# Patient Record
Sex: Female | Born: 1970 | Race: White | Hispanic: No | Marital: Married | Smoking: Never smoker
Health system: Southern US, Community
[De-identification: ages and names within clinical notes are randomized; demographics above are authoritative.]

## PROBLEM LIST (undated history)

## (undated) DIAGNOSIS — K649 Unspecified hemorrhoids: Secondary | ICD-10-CM

## (undated) DIAGNOSIS — Z8619 Personal history of other infectious and parasitic diseases: Secondary | ICD-10-CM

## (undated) DIAGNOSIS — Z0000 Encounter for general adult medical examination without abnormal findings: Secondary | ICD-10-CM

## (undated) DIAGNOSIS — R109 Unspecified abdominal pain: Secondary | ICD-10-CM

## (undated) DIAGNOSIS — R87610 Atypical squamous cells of undetermined significance on cytologic smear of cervix (ASC-US): Secondary | ICD-10-CM

## (undated) DIAGNOSIS — E559 Vitamin D deficiency, unspecified: Secondary | ICD-10-CM

## (undated) DIAGNOSIS — Z872 Personal history of diseases of the skin and subcutaneous tissue: Secondary | ICD-10-CM

## (undated) DIAGNOSIS — Z9889 Other specified postprocedural states: Secondary | ICD-10-CM

## (undated) DIAGNOSIS — F419 Anxiety disorder, unspecified: Secondary | ICD-10-CM

## (undated) DIAGNOSIS — K219 Gastro-esophageal reflux disease without esophagitis: Secondary | ICD-10-CM

## (undated) DIAGNOSIS — R32 Unspecified urinary incontinence: Secondary | ICD-10-CM

## (undated) DIAGNOSIS — R112 Nausea with vomiting, unspecified: Secondary | ICD-10-CM

## (undated) HISTORY — DX: Personal history of other infectious and parasitic diseases: Z86.19

## (undated) HISTORY — DX: Unspecified hemorrhoids: K64.9

## (undated) HISTORY — DX: Unspecified urinary incontinence: R32

## (undated) HISTORY — DX: Gastro-esophageal reflux disease without esophagitis: K21.9

## (undated) HISTORY — DX: Personal history of diseases of the skin and subcutaneous tissue: Z87.2

## (undated) HISTORY — DX: Nausea with vomiting, unspecified: R11.2

## (undated) HISTORY — DX: Unspecified abdominal pain: R10.9

## (undated) HISTORY — DX: Atypical squamous cells of undetermined significance on cytologic smear of cervix (ASC-US): R87.610

## (undated) HISTORY — DX: Encounter for general adult medical examination without abnormal findings: Z00.00

## (undated) HISTORY — DX: Anxiety disorder, unspecified: F41.9

## (undated) HISTORY — DX: Other specified postprocedural states: Z98.890

## (undated) HISTORY — DX: Vitamin D deficiency, unspecified: E55.9

---
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
## 1998-03-20 ENCOUNTER — Encounter: Admission: RE | Admit: 1998-03-20 | Discharge: 1998-06-18 | Payer: Self-pay | Admitting: Gynecology

## 1998-09-19 ENCOUNTER — Inpatient Hospital Stay (HOSPITAL_COMMUNITY): Admission: AD | Admit: 1998-09-19 | Discharge: 1998-09-22 | Payer: Self-pay | Admitting: Gynecology

## 2000-07-15 ENCOUNTER — Other Ambulatory Visit: Admission: RE | Admit: 2000-07-15 | Discharge: 2000-07-15 | Payer: Self-pay | Admitting: *Deleted

## 2001-01-20 ENCOUNTER — Inpatient Hospital Stay (HOSPITAL_COMMUNITY): Admission: AD | Admit: 2001-01-20 | Discharge: 2001-01-22 | Payer: Self-pay | Admitting: Gynecology

## 2001-08-09 ENCOUNTER — Other Ambulatory Visit: Admission: RE | Admit: 2001-08-09 | Discharge: 2001-08-09 | Payer: Self-pay | Admitting: *Deleted

## 2002-10-18 ENCOUNTER — Other Ambulatory Visit: Admission: RE | Admit: 2002-10-18 | Discharge: 2002-10-18 | Payer: Self-pay | Admitting: Gynecology

## 2004-02-13 ENCOUNTER — Emergency Department (HOSPITAL_COMMUNITY): Admission: AD | Admit: 2004-02-13 | Discharge: 2004-02-13 | Payer: Self-pay | Admitting: Family Medicine

## 2004-02-17 ENCOUNTER — Emergency Department (HOSPITAL_COMMUNITY): Admission: AD | Admit: 2004-02-17 | Discharge: 2004-02-17 | Payer: Self-pay | Admitting: Family Medicine

## 2004-02-21 ENCOUNTER — Other Ambulatory Visit: Admission: RE | Admit: 2004-02-21 | Discharge: 2004-02-21 | Payer: Self-pay | Admitting: Gynecology

## 2004-03-21 ENCOUNTER — Emergency Department (HOSPITAL_COMMUNITY): Admission: AD | Admit: 2004-03-21 | Discharge: 2004-03-21 | Payer: Self-pay | Admitting: Family Medicine

## 2005-02-11 ENCOUNTER — Encounter: Admission: RE | Admit: 2005-02-11 | Discharge: 2005-02-11 | Payer: Self-pay | Admitting: Internal Medicine

## 2005-03-06 ENCOUNTER — Other Ambulatory Visit: Admission: RE | Admit: 2005-03-06 | Discharge: 2005-03-06 | Payer: Self-pay | Admitting: Gynecology

## 2006-03-24 ENCOUNTER — Other Ambulatory Visit: Admission: RE | Admit: 2006-03-24 | Discharge: 2006-03-24 | Payer: Self-pay | Admitting: Gynecology

## 2006-09-29 ENCOUNTER — Ambulatory Visit (HOSPITAL_COMMUNITY): Admission: RE | Admit: 2006-09-29 | Discharge: 2006-09-29 | Payer: Self-pay | Admitting: *Deleted

## 2007-03-30 ENCOUNTER — Other Ambulatory Visit: Admission: RE | Admit: 2007-03-30 | Discharge: 2007-03-30 | Payer: Self-pay | Admitting: Gynecology

## 2008-08-21 ENCOUNTER — Encounter: Payer: Self-pay | Admitting: Gynecology

## 2008-08-21 ENCOUNTER — Other Ambulatory Visit: Admission: RE | Admit: 2008-08-21 | Discharge: 2008-08-21 | Payer: Self-pay | Admitting: Gynecology

## 2008-08-21 ENCOUNTER — Ambulatory Visit: Payer: Self-pay | Admitting: Gynecology

## 2009-10-28 ENCOUNTER — Ambulatory Visit: Payer: Self-pay | Admitting: Gynecology

## 2009-10-28 ENCOUNTER — Encounter: Payer: Self-pay | Admitting: Gynecology

## 2009-10-28 ENCOUNTER — Other Ambulatory Visit: Admission: RE | Admit: 2009-10-28 | Discharge: 2009-10-28 | Payer: Self-pay | Admitting: Gynecology

## 2010-04-21 ENCOUNTER — Encounter: Admission: RE | Admit: 2010-04-21 | Discharge: 2010-04-21 | Payer: Self-pay | Admitting: Gynecology

## 2010-10-13 ENCOUNTER — Emergency Department (HOSPITAL_COMMUNITY): Admission: EM | Admit: 2010-10-13 | Discharge: 2010-10-13 | Payer: Self-pay | Admitting: Emergency Medicine

## 2010-10-29 ENCOUNTER — Ambulatory Visit: Payer: Self-pay | Admitting: Gynecology

## 2010-10-29 ENCOUNTER — Other Ambulatory Visit: Admission: RE | Admit: 2010-10-29 | Discharge: 2010-10-29 | Payer: Self-pay | Admitting: Gynecology

## 2011-01-03 ENCOUNTER — Encounter: Payer: Self-pay | Admitting: Internal Medicine

## 2011-02-25 LAB — POCT PREGNANCY, URINE: Preg Test, Ur: NEGATIVE

## 2011-02-25 LAB — POCT URINALYSIS DIPSTICK
Bilirubin Urine: NEGATIVE
Glucose, UA: NEGATIVE mg/dL
Ketones, ur: NEGATIVE mg/dL
Protein, ur: NEGATIVE mg/dL
Specific Gravity, Urine: 1.01 (ref 1.005–1.030)

## 2011-03-25 ENCOUNTER — Other Ambulatory Visit: Payer: Self-pay | Admitting: Gynecology

## 2011-03-25 DIAGNOSIS — Z1231 Encounter for screening mammogram for malignant neoplasm of breast: Secondary | ICD-10-CM

## 2011-04-23 ENCOUNTER — Ambulatory Visit
Admission: RE | Admit: 2011-04-23 | Discharge: 2011-04-23 | Disposition: A | Discharge status: Home / Self Care | Payer: 59 | Source: Ambulatory Visit | Attending: Gynecology | Admitting: Gynecology

## 2011-04-23 DIAGNOSIS — Z1231 Encounter for screening mammogram for malignant neoplasm of breast: Secondary | ICD-10-CM

## 2011-05-01 NOTE — H&P (Signed)
[facility]  Patient:    Valerie Jones, Valerie Jones                       MRN: [id number] Adm. Date:  01/20/01 Attending:  Marcial Pacas P. Fontaine, M.D.                         History and Physical  CHIEF COMPLAINT:              1. Pregnancy at term.                               2. Increased maternal discomfort.                               3. History of prior macrosomia.  HISTORY OF PRESENT ILLNESS:   A 40 year old, G2, P66 female at term gestation with increased maternal discomfort and history of 10 pound infant last delivery for induction secondary to above.  PAST MEDICAL HISTORY:         Uncomplicated.  PAST SURGICAL HISTORY:        Appendectomy.  ALLERGIES:                    SULFA.  REVIEW OF SYSTEMS:            Noncontributory.  FAMILY HISTORY:               Noncontributory.  PHYSICAL EXAMINATION:  VITAL SIGNS:                  Afebrile.  Vital signs are stable.  HEENT:                        Normal.  LUNGS:                        Clear.  CARDIAC:                      Regular rate without rubs, murmurs, or gallops.  ABDOMEN:                      Gravid vertex fetus.  PELVIC:                       Fingertip 50%, -2 station.  ASSESSMENT:                   A 40 year old, G2, P44 female, term gestation, history of prior fetal macrosomia, increasing discomfort, who desires induction.  Options of expectant management versus induction were reviewed, and the patient elected for induction.  We will plan on serial induction to include Cervidil in the p.m., Pitocin in the a.m.  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  The patients questions are answered to her satisfaction. DD:  01/19/01 TD:  01/19/01 Job: [id number]

## 2011-05-01 NOTE — Discharge Summary (Signed)
[facility] of [facility]  Patient:    Valerie Jones, Valerie Jones                       MRN: [id number] Adm. Date:  [id number] Disc. Date: [id number] Attending:  Douglass Rivers Dictator:   Antony Contras, NP                           Discharge Summary  DISCHARGE DIAGNOSES:          1. Intrauterine pregnancy at 40 weeks.                               2. History of increased maternal discomfort.                               3. Planned induction of labor.                               4. Spontaneous onset of labor.  PROCEDURES:                   Normal spontaneous vaginal delivery of viable infant over midline episiotomy.  HISTORY OF PRESENT ILLNESS:   The patient is a 40 year old gravida 2, para 1-0-0-1, with an LMP of Apr 16, 2000, [facility] of January 21, 2001.  The patient had a history of previous macrosomic infant.  LABORATORY DATA:              Blood type O positive, antibody screen negative, RPR, HBsAg, and HIV nonreactive.  MSAFP normal.  GBS positive.  HOSPITAL COURSE AND TREATMENT:                The patient was anticipated to be admitted for induction of labor secondary to her history of prior macrosomia and increased maternal discomfort as she got closer to term, but she did present in spontaneous labor on January 20, 2001, prior to coming in for induction.  She was found to be 4-5 cm dilated with spontaneous rupture of membranes.  She did progress to complete dilatation and was delivered of an Apgar 51, 40 female infant over a midline episiotomy.  Infants weight was 9 pounds 6 ounces, manual extraction of the placenta was required with some shearing of the umbilical cord after this procedure.  The delivery was performed by Dr. Lily Peer.  POSTPARTUM COURSE:            The patient remained afebrile.  Had no difficulty voiding and was able to discharged on her second postpartum day in satisfactory condition.  CBC - hematocrit 28.8, hemoglobin 9.9, WBC 17, platelets  172.  DISPOSITION:                  Follow up in six weeks.  Continue with prenatal vitamins, iron, Motrin and Tylox for pain.  The patient is also given Paxil 20 mg q.d. secondary to a history of mild depression. DD:  02/28/01 TD:  03/01/01 Job: [id number]

## 2011-10-06 ENCOUNTER — Telehealth: Payer: Self-pay | Admitting: *Deleted

## 2011-10-06 ENCOUNTER — Other Ambulatory Visit: Payer: Self-pay | Admitting: *Deleted

## 2011-10-06 DIAGNOSIS — Z3049 Encounter for surveillance of other contraceptives: Secondary | ICD-10-CM

## 2011-10-06 MED ORDER — LEVONORGESTREL 20 MCG/24HR IU IUD: INTRAUTERINE_SYSTEM | Freq: Once | INTRAUTERINE | Status: DC

## 2011-10-06 NOTE — Telephone Encounter (Signed)
Patient informed benefits on Mirena IUD.  Her portion would be $389.50.  Patient does want to proceed and will call with the first day of her period to set up with Dr. Audie Box.

## 2011-10-06 NOTE — Progress Notes (Signed)
Patient portion is $389.50 for Mirena IUD.

## 2011-10-06 NOTE — Telephone Encounter (Signed)
Patient informed benefits for Mirena IUD.  Her portion would be $389.50.  Will call with first day of period to set up insert with Dr. Audie Box.

## 2011-10-06 NOTE — Telephone Encounter (Signed)
Message copied by Libby Maw on Tue Oct 06, 2011 12:03 PM ------      Message from: Richardson Chiquito      Created: Thu Sep 17, 2011  5:28 PM       Pt called for Mirena  Benefits, I was gonna try and do it for you but never got around to it. Can you do this or it can wait til Tuesday and I can help with it. I had checked them back in March but pt hadnt met her deductible so she wanted [place] to check them again. I placed the chart on your desk. Sherrilyn Rist

## 2011-10-06 NOTE — Telephone Encounter (Signed)
Message copied by Libby Maw on Tue Oct 06, 2011 10:54 AM ------      Message from: Richardson Chiquito      Created: Thu Sep 17, 2011  5:28 PM       Pt called for Mirena  Benefits, I was gonna try and do it for you but never got around to it. Can you do this or it can wait til Tuesday and I can help with it. I had checked them back in March but pt hadnt met her deductible so she wanted [place] to check them again. I placed the chart on your desk. Sherrilyn Rist

## 2011-10-13 ENCOUNTER — Other Ambulatory Visit: Payer: Self-pay | Admitting: Gynecology

## 2011-10-13 ENCOUNTER — Ambulatory Visit (INDEPENDENT_AMBULATORY_CARE_PROVIDER_SITE_OTHER): Payer: 59 | Admitting: Gynecology

## 2011-10-13 ENCOUNTER — Encounter: Payer: Self-pay | Admitting: Gynecology

## 2011-10-13 VITALS — BP 116/70

## 2011-10-13 DIAGNOSIS — IMO0001 Reserved for inherently not codable concepts without codable children: Secondary | ICD-10-CM

## 2011-10-13 DIAGNOSIS — Z3041 Encounter for surveillance of contraceptive pills: Secondary | ICD-10-CM

## 2011-10-13 DIAGNOSIS — Z3049 Encounter for surveillance of other contraceptives: Secondary | ICD-10-CM

## 2011-10-13 NOTE — Progress Notes (Signed)
Patient presents for Mirena IUD placement. She is currently on a regular menses. She is ready the booklet has no questions and signed the consent form. I reviewed the insertional process with her and the risks to include infection uterine perforation requiring surgery to remove and the risk of failure as a contraceptive. Patient understands and accepts  Exam Pelvic external BUS vagina normal with slight menses flow cervix normal uterus anteverted normal size midline mobile nontender adnexa without masses or tenderness  Cervix was cleansed with Betadine, single tooth tenaculum anterior lip stabilization, uterus was sounded and a Mirena IUD was placed according to manufacturer's recommendations. The strings were trimmed, the patient tolerated well and will follow up in one month for postinsertional check.

## 2011-11-03 ENCOUNTER — Encounter: Payer: 59 | Admitting: Gynecology

## 2011-11-12 ENCOUNTER — Ambulatory Visit (INDEPENDENT_AMBULATORY_CARE_PROVIDER_SITE_OTHER): Payer: 59 | Admitting: Gynecology

## 2011-11-12 ENCOUNTER — Encounter: Payer: Self-pay | Admitting: Gynecology

## 2011-11-12 VITALS — BP 92/60 | Ht 64.0 in | Wt 132.0 lb

## 2011-11-12 DIAGNOSIS — Z131 Encounter for screening for diabetes mellitus: Secondary | ICD-10-CM

## 2011-11-12 DIAGNOSIS — Z13220 Encounter for screening for lipoid disorders: Secondary | ICD-10-CM

## 2011-11-12 DIAGNOSIS — Z30431 Encounter for routine checking of intrauterine contraceptive device: Secondary | ICD-10-CM

## 2011-11-12 DIAGNOSIS — R823 Hemoglobinuria: Secondary | ICD-10-CM

## 2011-11-12 DIAGNOSIS — Z01419 Encounter for gynecological examination (general) (routine) without abnormal findings: Secondary | ICD-10-CM

## 2011-11-12 NOTE — Progress Notes (Signed)
Addended byCammie Mcgee T on: 11/12/2011 03:16 PM   Modules accepted: Orders

## 2011-11-12 NOTE — Patient Instructions (Signed)
Return in one year for annual exam, sooner if any issues.

## 2011-11-12 NOTE — Progress Notes (Signed)
Valerie Jones 29-Dec-1970 [id number]        40 y.o.  for annual exam.  Doing well. Recently had IUD placed about a month ago has had some intermittent spotting and some mild breast tenderness.  Past medical history,surgical history, medications, allergies, family history and social history were all reviewed and documented in the EPIC chart. ROS:  Was performed and pertinent positives and negatives are included in the history.  Exam: chaperone present Filed Vitals:   11/12/11 1412  BP: 92/60   General appearance  Normal Skin grossly normal Head/Neck normal with no cervical or supraclavicular adenopathy thyroid normal Lungs  clear Cardiac RR, without RMG Abdominal  soft, nontender, without masses, organomegaly or hernia Breasts  examined lying and sitting without masses, retractions, discharge or axillary adenopathy. Pelvic  Ext/BUS/vagina  normal   Cervix  normal  IUD string visualized  Uterus  anteverted, normal size, shape and contour, midline and mobile nontender   Adnexa  Without masses or tenderness    Anus and perineum  normal   Rectovaginal  normal sphincter tone without palpated masses or tenderness.    Assessment/Plan:  40 y.o. female for annual exam.    1. IUD. IUD string visualized some mild spotting will keep track of this assuming it resolves then we'll follow expectantly. Continue she'll call. 2. Breast health. SBE monthly reviewed. Had mammography in May we'll continue with annual mammography.  Some mild breast tenderness which I think is secondary to the IUD. Assuming this resolves will follow. 3. Pap smear. Patient has no history of abnormal Pap smears. She has a number of normal annual reports in her chart. Her last Pap smear was 2011. I reviewed current guidelines with less frequent screening and she agrees with this I did not do a Pap smear this year and we will plan every 3 or Pap smears. 4. Health maintenance. Baseline CBC glucose lipid profile urinalysis ordered.  Assuming she continues well then she'll see [place] in a year sooner as needed.    Dara Lords MD, 2:52 PM 11/12/2011

## 2012-06-08 ENCOUNTER — Other Ambulatory Visit: Payer: Self-pay | Admitting: Gynecology

## 2012-06-08 DIAGNOSIS — Z1231 Encounter for screening mammogram for malignant neoplasm of breast: Secondary | ICD-10-CM

## 2012-07-04 ENCOUNTER — Ambulatory Visit
Admission: RE | Admit: 2012-07-04 | Discharge: 2012-07-04 | Disposition: A | Discharge status: Home / Self Care | Payer: 59 | Source: Ambulatory Visit | Attending: Gynecology | Admitting: Gynecology

## 2012-07-04 DIAGNOSIS — Z1231 Encounter for screening mammogram for malignant neoplasm of breast: Secondary | ICD-10-CM

## 2012-09-27 ENCOUNTER — Encounter (HOSPITAL_COMMUNITY): Payer: Self-pay | Admitting: Emergency Medicine

## 2012-09-27 ENCOUNTER — Emergency Department (HOSPITAL_COMMUNITY)
Admission: EM | Admit: 2012-09-27 | Discharge: 2012-09-27 | Disposition: A | Discharge status: Home / Self Care | Payer: 59 | Source: Home / Self Care

## 2012-09-27 DIAGNOSIS — N390 Urinary tract infection, site not specified: Secondary | ICD-10-CM

## 2012-09-27 LAB — POCT URINALYSIS DIP (DEVICE)
Bilirubin Urine: NEGATIVE
Glucose, UA: NEGATIVE mg/dL
Ketones, ur: NEGATIVE mg/dL
Protein, ur: NEGATIVE mg/dL
Specific Gravity, Urine: <=1.005 (ref 1.005–1.030)

## 2012-09-27 LAB — POCT PREGNANCY, URINE: Preg Test, Ur: NEGATIVE

## 2012-09-27 MED ORDER — CEPHALEXIN 500 MG PO CAPS: 500.0000 mg | ORAL_CAPSULE | Freq: Three times a day (TID) | ORAL | Status: DC

## 2012-09-27 NOTE — ED Notes (Signed)
Pt c/o UTI sx x1 day... Sx include: urgency, burning when urinating.. She denies: pelvic pressure/pain, fevers, vomiting, nausea, diarrhea... Pt is alert w/no signs of distress.

## 2012-09-27 NOTE — ED Provider Notes (Signed)
Medical screening examination/treatment/procedure(s) were performed by resident physician or non-physician practitioner and as supervising physician I was immediately available for consultation/collaboration.   KINDL,JAMES DOUGLAS MD.    James D Kindl, MD 09/27/12 1831 

## 2012-09-27 NOTE — ED Provider Notes (Signed)
History     CSN: [id number]  Arrival date & time 09/27/12  3086   None     Chief Complaint  Patient presents with  . Urinary Tract Infection    (Consider location/radiation/quality/duration/timing/severity/associated sxs/prior treatment) HPI Comments: 41 year old female presents with a one-day history of urinary urgency and dysuria. She denies other symptoms. No fever abdominal pain back pain nausea vomiting or bleeding.  Patient is a 41 y.o. female presenting with urinary tract infection.  Urinary Tract Infection    History reviewed. No pertinent past medical history.  Past Surgical History  Procedure Date  . Appendectomy 1988  . Intrauterine device insertion 10/13/2011    MIRENA    Family History  Problem Relation Age of Onset  . Cancer Paternal Grandfather     NON HODGKIN LYMPHOMA  . Hypertension Father     History  Substance Use Topics  . Smoking status: Never Smoker   . Smokeless tobacco: Never Used  . Alcohol Use: Yes     3 NIGHTS A WEEK ... WINE    OB History    Grav Para Term Preterm Abortions TAB SAB Ect Mult Living   2 2        2       Review of Systems  Constitutional: Negative for fever, activity change and fatigue.  HENT: Negative.   Genitourinary: Positive for dysuria, urgency, frequency and hematuria. Negative for flank pain, vaginal discharge, difficulty urinating, vaginal pain and menstrual problem.  Musculoskeletal: Negative.   Neurological: Negative.     Allergies  Sulfa antibiotics  Home Medications   Current Outpatient Rx  Name Route Sig Dispense Refill  . CEPHALEXIN 500 MG PO CAPS Oral Take 1 capsule (500 mg total) by mouth 3 (three) times daily. 21 capsule 0    BP 92/64  Pulse 49  Temp 98 F (36.7 C) (Oral)  Resp 16  SpO2 100%  LMP 09/27/2012  Physical Exam  Constitutional: She is oriented to person, place, and time. She appears well-developed and well-nourished. No distress.  Eyes: Conjunctivae normal and EOM are  normal.  Neck: Neck supple.  Pulmonary/Chest: Effort normal.  Abdominal: Soft. There is no tenderness.  Musculoskeletal: Normal range of motion. She exhibits no edema.  Neurological: She is alert and oriented to person, place, and time.  Skin: Skin is warm and dry.  Psychiatric: She has a normal mood and affect.    ED Course  Procedures (including critical care time)  Labs Reviewed  POCT URINALYSIS DIP (DEVICE) - Abnormal; Notable for the following:    Hgb urine dipstick MODERATE (*)     Leukocytes, UA MODERATE (*)  Biochemical Testing Only. Please order routine urinalysis from main lab if confirmatory testing is needed.   All other components within normal limits  POCT PREGNANCY, URINE   No results found.   1. UTI (lower urinary tract infection)       MDM  Keflex 500 mg 3 times a day for 7 days. Drink plenty of fluids stay well hydrated may also take AZO 100-200 mg 3 times a day when necessary        Hayden Rasmussen, NP 09/27/12 1021

## 2013-01-17 ENCOUNTER — Encounter: Payer: Self-pay | Admitting: Gynecology

## 2013-01-17 ENCOUNTER — Ambulatory Visit (INDEPENDENT_AMBULATORY_CARE_PROVIDER_SITE_OTHER): Payer: 59 | Admitting: Gynecology

## 2013-01-17 ENCOUNTER — Other Ambulatory Visit (HOSPITAL_COMMUNITY)
Admission: RE | Admit: 2013-01-17 | Discharge: 2013-01-17 | Disposition: A | Discharge status: Home / Self Care | Payer: 59 | Source: Ambulatory Visit | Attending: Gynecology | Admitting: Gynecology

## 2013-01-17 VITALS — BP 110/60 | Ht 64.0 in | Wt 129.0 lb

## 2013-01-17 DIAGNOSIS — Z13220 Encounter for screening for lipoid disorders: Secondary | ICD-10-CM

## 2013-01-17 DIAGNOSIS — Z01419 Encounter for gynecological examination (general) (routine) without abnormal findings: Secondary | ICD-10-CM

## 2013-01-17 DIAGNOSIS — Z30431 Encounter for routine checking of intrauterine contraceptive device: Secondary | ICD-10-CM

## 2013-01-17 DIAGNOSIS — Z1151 Encounter for screening for human papillomavirus (HPV): Secondary | ICD-10-CM | POA: Insufficient documentation

## 2013-01-17 NOTE — Progress Notes (Signed)
Valerie Jones [birth year] [id number]        42 y.o.  [id number] for annual exam.  Doing well. Several issues noted below.  Past medical history,surgical history, medications, allergies, family history and social history were all reviewed and documented in the EPIC chart. ROS:  Was performed and pertinent positives and negatives are included in the history.  Exam: Kim assistant Filed Vitals:   01/17/13 1529  BP: 110/60  Height: 5\' 4"  (1.626 m)  Weight: 129 lb (58.514 kg)   General appearance  Normal Skin grossly normal Head/Neck normal with no cervical or supraclavicular adenopathy thyroid normal Lungs  clear Cardiac RR, without RMG Abdominal  soft, nontender, without masses, organomegaly or hernia Breasts  examined lying and sitting without masses, retractions, discharge or axillary adenopathy. Pelvic  Ext/BUS/vagina  normal   Cervix  Normal with IUD string visualized. Pap/HPV  Uterus  anteverted, normal size, shape and contour, midline and mobile nontender   Adnexa  Without masses or tenderness    Anus and perineum  normal   Rectovaginal  normal sphincter tone without palpated masses or tenderness.    Assessment/Plan:  42 y.o. [id number] female for annual exam.   1. Mirena IUD 09/2011. Patient doing well with light menses. We'll continue to monitor. 2. Difficulty achieving orgasm. Patient notes most recently she has difficulty achieving orgasm. She will start to reach but then it goes away. Nothing else is changed in her life as far as relationships/medications. No other symptoms such as neuropathy type symptoms or diabetic symptoms. Exam is normal with normal sensation.  ItWe'll check a baseline comprehensive metabolic panel, vitamin D level, TSH. Will monitor at present. If other symptoms arise and we'll report these. 3. Mammography 06/2012. We'll continue with annual mammography. SBE monthly reviewed. 4. Pap smear 2011. Pap/HPV done today. We'll plan every 5 year screening if  normalized she has no history of abnormal Pap smears previously. 5. Health maintenance.  CBC comprehensive metabolic panel TSH urinalysis vitamin D lipid profile ordered. Follow for results otherwise in one year, sooner as needed.    Dara Lords MD, 4:03 PM 01/17/2013

## 2013-01-17 NOTE — Patient Instructions (Signed)
Follow up in one year for annual exam 

## 2013-01-18 LAB — COMPREHENSIVE METABOLIC PANEL
AST: 24 U/L (ref 0–37)
Albumin: 4.5 g/dL (ref 3.5–5.2)
Alkaline Phosphatase: 33 U/L — ABNORMAL LOW (ref 39–117)
Chloride: 103 mEq/L (ref 96–112)
Glucose, Bld: 86 mg/dL (ref 70–99)
Potassium: 3.9 mEq/L (ref 3.5–5.3)
Sodium: 140 mEq/L (ref 135–145)
Total Protein: 6.6 g/dL (ref 6.0–8.3)

## 2013-01-18 LAB — URINALYSIS W MICROSCOPIC + REFLEX CULTURE
Casts: NONE SEEN
Crystals: NONE SEEN
Leukocytes, UA: NEGATIVE
Nitrite: NEGATIVE
Specific Gravity, Urine: 1.008 (ref 1.005–1.030)
Squamous Epithelial / LPF: NONE SEEN
pH: 7.5 (ref 5.0–8.0)

## 2013-01-18 LAB — CBC WITH DIFFERENTIAL/PLATELET
Basophils Absolute: 0 10*3/uL (ref 0.0–0.1)
Basophils Relative: 1 % (ref 0–1)
Hemoglobin: 13 g/dL (ref 12.0–15.0)
Lymphocytes Relative: 25 % (ref 12–46)
MCHC: 35.5 g/dL (ref 30.0–36.0)
Monocytes Relative: 6 % (ref 3–12)
Neutro Abs: 4.9 10*3/uL (ref 1.7–7.7)
Neutrophils Relative %: 67 % (ref 43–77)
WBC: 7.2 10*3/uL (ref 4.0–10.5)

## 2013-01-18 LAB — LIPID PANEL: LDL Cholesterol: 75 mg/dL (ref 0–99)

## 2013-01-18 LAB — TSH: TSH: 2.964 u[IU]/mL (ref 0.350–4.500)

## 2013-07-24 ENCOUNTER — Other Ambulatory Visit: Payer: Self-pay

## 2013-07-24 DIAGNOSIS — Z1231 Encounter for screening mammogram for malignant neoplasm of breast: Secondary | ICD-10-CM

## 2013-08-07 ENCOUNTER — Emergency Department
Admission: EM | Admit: 2013-08-07 | Discharge: 2013-08-07 | Disposition: A | Discharge status: Home / Self Care | Payer: 59 | Source: Home / Self Care | Attending: Emergency Medicine | Admitting: Emergency Medicine

## 2013-08-07 ENCOUNTER — Encounter: Payer: Self-pay | Admitting: *Deleted

## 2013-08-07 DIAGNOSIS — L509 Urticaria, unspecified: Secondary | ICD-10-CM

## 2013-08-07 MED ORDER — PREDNISONE 10 MG PO TABS: ORAL_TABLET | ORAL | Status: DC

## 2013-08-07 MED ORDER — METHYLPREDNISOLONE SODIUM SUCC 125 MG IJ SOLR
125.0000 mg | Freq: Once | INTRAMUSCULAR | Status: AC
  Administered 2013-08-07: 125 mg via INTRAMUSCULAR

## 2013-08-07 NOTE — ED Provider Notes (Signed)
CSN: [id number]     Arrival date & time 08/07/13  1502 History     First MD Initiated Contact with Patient 08/07/13 1504     Chief Complaint  Patient presents with  . Urticaria   (Consider location/radiation/quality/duration/timing/severity/associated sxs/prior Treatment) HPI This patient complains of a RASH.  Had poison ivy and cellulitis on leg, improved with prednisone and Clinda, then stopped pred after 5 days and developed rash, so then stopped Clinda today.  Very itchy.   Location: whole body  Onset: last 1-2 days   Course: worsening Self-treated with: Benedryl             Improvement with treatment: minimal improvement.  History Itching: yes  Tenderness: no  New medications/antibiotics: yes, see above  Pet exposure: no  Recent travel or tropical exposure: no  New soaps, shampoos, detergent, clothing: no  Tick/insect exposure: no   Red Flags Feeling ill: no  Fever: no  Facial/tongue swelling/difficulty breathing:  no  Diabetic or immunocompromised: yes, on Prednisone last week     History reviewed. No pertinent past medical history. Past Surgical History  Procedure Laterality Date  . Appendectomy  1988  . Intrauterine device insertion  10/13/2011    MIRENA   Family History  Problem Relation Age of Onset  . Cancer Paternal Grandfather     NON HODGKIN LYMPHOMA  . Hypertension Father    History  Substance Use Topics  . Smoking status: Never Smoker   . Smokeless tobacco: Never Used  . Alcohol Use: Yes     Comment: 3 NIGHTS A WEEK ... WINE   OB History   Grav Para Term Preterm Abortions TAB SAB Ect Mult Living   2 2 2       2      Review of Systems  All other systems reviewed and are negative.    Allergies  Clindamycin/lincomycin and Sulfa antibiotics  Home Medications   Current Outpatient Rx  Name  Route  Sig  Dispense  Refill  . predniSONE (DELTASONE) 10 MG tablet      20mg  BID for 3 days, then 10mg  BID for 3 days, then 10mg  daily for 3  days, then stop, Disp QS   21 tablet   0    BP 104/71  Pulse 51  Temp(Src) 97.7 F (36.5 C) (Oral)  Resp 12  Ht 5\' 4"  (1.626 m)  Wt 134 lb (60.782 kg)  BMI 22.99 kg/m2  SpO2 100% Physical Exam  Nursing note and vitals reviewed. Constitutional: She is oriented to person, place, and time. She appears well-developed and well-nourished.  HENT:  Head: Normocephalic and atraumatic.  Eyes: No scleral icterus.  Neck: Neck supple.  Cardiovascular: Regular rhythm and normal heart sounds.   Pulmonary/Chest: Effort normal and breath sounds normal. No respiratory distress.  Neurological: She is alert and oriented to person, place, and time.  Skin: Skin is warm and dry.  Widespread erythematous rash on whole body, legs, arms, torso.  Worse on legs.  No distinct signs of cellulitis on L leg where had previous infection, but does still have signs of poison ivy dermatitis on legs.  No facial involvement.   OP patent.  Psychiatric: She has a normal mood and affect. Her speech is normal.    ED Course   Procedures (including critical care time)  Labs Reviewed - No data to display No results found. 1. Hives     MDM  Patient with hives secondary to likely Clindamycin.  She has stopped the  med, but I don't believe that the rash represents a continued cellulitis.  So I don't believe that she needs further ABX, but she needs to watch to make sure it doesn't return.  Will give shot of Solumedrol here in clinic, then a longer prednisone taper.  Can continue Benedryl PO as needed.    Marlaine Hind, MD 08/07/13 1534

## 2013-08-07 NOTE — ED Notes (Signed)
Valerie Jones was placed on Clindamycin and prednisone for poison ivy that turned to cellulitis on her right thigh. When she stopped the prednisone and continued the clindamycin she developed widespread hives that have continued.

## 2013-08-24 ENCOUNTER — Ambulatory Visit
Admission: RE | Admit: 2013-08-24 | Discharge: 2013-08-24 | Disposition: A | Discharge status: Home / Self Care | Payer: 59 | Source: Ambulatory Visit

## 2013-08-24 DIAGNOSIS — Z1231 Encounter for screening mammogram for malignant neoplasm of breast: Secondary | ICD-10-CM

## 2013-08-28 ENCOUNTER — Other Ambulatory Visit: Payer: Self-pay | Admitting: Gynecology

## 2013-08-28 DIAGNOSIS — R928 Other abnormal and inconclusive findings on diagnostic imaging of breast: Secondary | ICD-10-CM

## 2013-10-19 ENCOUNTER — Other Ambulatory Visit: Payer: Self-pay

## 2014-01-18 ENCOUNTER — Telehealth: Payer: Self-pay | Admitting: *Deleted

## 2014-01-18 ENCOUNTER — Encounter: Payer: Self-pay | Admitting: Gynecology

## 2014-01-18 ENCOUNTER — Ambulatory Visit (INDEPENDENT_AMBULATORY_CARE_PROVIDER_SITE_OTHER): Payer: 59 | Admitting: Gynecology

## 2014-01-18 VITALS — BP 112/70 | Ht 64.0 in | Wt 132.0 lb

## 2014-01-18 DIAGNOSIS — K644 Residual hemorrhoidal skin tags: Secondary | ICD-10-CM

## 2014-01-18 DIAGNOSIS — L989 Disorder of the skin and subcutaneous tissue, unspecified: Secondary | ICD-10-CM

## 2014-01-18 DIAGNOSIS — Z30431 Encounter for routine checking of intrauterine contraceptive device: Secondary | ICD-10-CM

## 2014-01-18 DIAGNOSIS — Z01419 Encounter for gynecological examination (general) (routine) without abnormal findings: Secondary | ICD-10-CM

## 2014-01-18 LAB — CBC WITH DIFFERENTIAL/PLATELET
BASOS PCT: 0 % (ref 0–1)
Basophils Absolute: 0 10*3/uL (ref 0.0–0.1)
Eosinophils Absolute: 0.1 10*3/uL (ref 0.0–0.7)
Eosinophils Relative: 2 % (ref 0–5)
HCT: 38.6 % (ref 36.0–46.0)
Hemoglobin: 13.1 g/dL (ref 12.0–15.0)
LYMPHS ABS: 1.4 10*3/uL (ref 0.7–4.0)
Lymphocytes Relative: 30 % (ref 12–46)
MCH: 31.3 pg (ref 26.0–34.0)
MCHC: 33.9 g/dL (ref 30.0–36.0)
MCV: 92.3 fL (ref 78.0–100.0)
Monocytes Absolute: 0.5 10*3/uL (ref 0.1–1.0)
Monocytes Relative: 10 % (ref 3–12)
NEUTROS PCT: 58 % (ref 43–77)
Neutro Abs: 2.7 10*3/uL (ref 1.7–7.7)
PLATELETS: 321 10*3/uL (ref 150–400)
RBC: 4.18 MIL/uL (ref 3.87–5.11)
RDW: 13.5 % (ref 11.5–15.5)
WBC: 4.7 10*3/uL (ref 4.0–10.5)

## 2014-01-18 LAB — LIPID PANEL
Cholesterol: 181 mg/dL (ref 0–200)
HDL: 66 mg/dL (ref >39)
LDL CALC: 95 mg/dL (ref 0–99)
Total CHOL/HDL Ratio: 2.7 Ratio
Triglycerides: 101 mg/dL (ref <150)
VLDL: 20 mg/dL (ref 0–40)

## 2014-01-18 LAB — COMPREHENSIVE METABOLIC PANEL
ALK PHOS: 36 U/L — AB (ref 39–117)
ALT: 11 U/L (ref 0–35)
AST: 14 U/L (ref 0–37)
Albumin: 4.5 g/dL (ref 3.5–5.2)
BILIRUBIN TOTAL: 0.7 mg/dL (ref 0.2–1.2)
BUN: 22 mg/dL (ref 6–23)
CO2: 30 meq/L (ref 19–32)
Calcium: 9.3 mg/dL (ref 8.4–10.5)
Chloride: 99 mEq/L (ref 96–112)
Creat: 0.75 mg/dL (ref 0.50–1.10)
Glucose, Bld: 92 mg/dL (ref 70–99)
Potassium: 4.1 mEq/L (ref 3.5–5.3)
SODIUM: 140 meq/L (ref 135–145)
TOTAL PROTEIN: 6.6 g/dL (ref 6.0–8.3)

## 2014-01-18 NOTE — Telephone Encounter (Signed)
Message copied by Thamas Jaegers on Thu Jan 18, 2014  3:49 PM ------      Message from: Anastasio Auerbach      Created: Thu Jan 18, 2014 11:35 AM       Referred to Gen. surgery reference external hemorrhoids bothersome to patient ------

## 2014-01-18 NOTE — Progress Notes (Signed)
Valerie Jones Jan 15, 1971 [id number]        42 y.o.  [id number] for annual exam.  Several issues noted below.  Past medical history,surgical history, problem list, medications, allergies, family history and social history were all reviewed and documented in the EPIC chart.  ROS:  Performed and pertinent positives and negatives are included in the history, assessment and plan .  Exam: Kim assistant Filed Vitals:   01/18/14 1003  BP: 112/70  Height: 5\' 4"  (1.626 m)  Weight: 132 lb (59.875 kg)   General appearance  Normal Skin grossly normal excepting 2 skin tags upper right inner thigh amenorrheic, Mirena IUD Head/Neck normal with no cervical or supraclavicular adenopathy thyroid normal Lungs  clear Cardiac RR, without RMG Abdominal  soft, nontender, without masses, organomegaly or hernia Breasts  examined lying and sitting without masses, retractions, discharge or axillary adenopathy. Pelvic  Ext/BUS/vagina  Normal  Cervix  Normal with IUD string visualized  Uterus  anteverted, normal size, shape and contour, midline and mobile nontender   Adnexa  Without masses or tenderness    Anus and perineum  Normal   Rectovaginal  Normal sphincter tone without palpated masses or tenderness. Old external hemorrhoids noted  Procedure: Skin overlying and surrounding the 2 upper inner right thigh skin tags was cleansed with Betadine, subsequently infiltrated with 1% lidocaine and both skin tags excised to the level of the surrounding skin. Silver nitrate hemostasis applied. Band-Aids applied. Postoperative instructions given. Specimen sent to pathology  Assessment/Plan:  43 y.o. [id number] female for annual exam, amenorrheic, Mirena IUD.   1. Mirena IUD 09/2011. Doing well without menses. IUD string visualized. Continue to follow. 2. Pap smear/HPV negative 2014. No Pap smear done today. No history of significant abnormal Pap smears. Plan repeat at 3-5 year interval. 3. Mammography 08/2013. Continue with  annual mammography. SBE monthly review. 4. External hemorrhoids that flare and are bothersome. Will refer to Gen. surgery to consider banding. 5. 2 classic upper inner right thigh skin tags. Patient wanted removed as per above note. Followup for pathology results. 6. Health maintenance. Baseline CBC comprehensive metabolic panel lipid profile urinalysis ordered. Followup one year, sooner as needed.   Note: This document was prepared with digital dictation and possible smart phrase technology. Any transcriptional errors that result from this process are unintentional.   Anastasio Auerbach MD, 11:26 AM 01/18/2014

## 2014-01-18 NOTE — Patient Instructions (Signed)
Office will call you to arrange surgical appointment for hemorrhoids. Office will call you with the pathology results from the skin biopsy. Followup in one year for annual exam.

## 2014-01-18 NOTE — Telephone Encounter (Signed)
Left message for Valerie Jones at [facility] to schedule and call me back with time date.

## 2014-01-19 LAB — URINALYSIS W MICROSCOPIC + REFLEX CULTURE
BACTERIA UA: NONE SEEN
Bilirubin Urine: NEGATIVE
CRYSTALS: NONE SEEN
Casts: NONE SEEN
Glucose, UA: NEGATIVE mg/dL
Hgb urine dipstick: NEGATIVE
Ketones, ur: NEGATIVE mg/dL
Leukocytes, UA: NEGATIVE
NITRITE: NEGATIVE
Protein, ur: NEGATIVE mg/dL
SPECIFIC GRAVITY, URINE: 1.008 (ref 1.005–1.030)
SQUAMOUS EPITHELIAL / LPF: NONE SEEN
UROBILINOGEN UA: 0.2 mg/dL (ref 0.0–1.0)
pH: 7.5 (ref 5.0–8.0)

## 2014-01-19 NOTE — Telephone Encounter (Signed)
Thayer Headings called back and left message in voicemail that pt appointment will be on 01/29/14 @ 1:30 pm with Dr.Thompson they will contact patient.

## 2014-01-29 ENCOUNTER — Ambulatory Visit (INDEPENDENT_AMBULATORY_CARE_PROVIDER_SITE_OTHER): Payer: Self-pay | Admitting: General Surgery

## 2014-02-01 ENCOUNTER — Encounter (INDEPENDENT_AMBULATORY_CARE_PROVIDER_SITE_OTHER): Payer: Self-pay | Admitting: General Surgery

## 2014-02-01 ENCOUNTER — Ambulatory Visit (INDEPENDENT_AMBULATORY_CARE_PROVIDER_SITE_OTHER): Payer: Commercial Managed Care - PPO | Admitting: General Surgery

## 2014-02-01 VITALS — BP 90/64 | HR 72 | Temp 98.6°F | Resp 16 | Ht 64.0 in | Wt 131.8 lb

## 2014-02-01 DIAGNOSIS — K644 Residual hemorrhoidal skin tags: Secondary | ICD-10-CM | POA: Insufficient documentation

## 2014-02-01 MED ORDER — HYDROCORTISONE 2.5 % RE CREA: 1.0000 "application " | TOPICAL_CREAM | Freq: Two times a day (BID) | RECTAL | Status: DC

## 2014-02-01 NOTE — Patient Instructions (Signed)
HEMORRHOIDS    Did you know... Hemorrhoids are one of the most common ailments known.  More than half the population will develop hemorrhoids, usually after age 45.  Millions of Americans currently suffer from hemorrhoids.  The average person suffers in silence for a long period before seeking medical care.  Today's treatment methods make some types of hemorrhoid removal much less painful.  What are hemorrhoids? Often described as "varicose veins of the anus and rectum", hemorrhoids are enlarged, bulging blood vessels in and about the anus and lower rectum. There are two types of hemorrhoids: external and internal, which refer to their location.  External (outside) hemorrhoids develop near the anus and are covered by very sensitive skin. These are usually painless. However, if a blood clot (thrombosis) develops in an external hemorrhoid, it becomes a painful, hard lump. The external hemorrhoid may bleed if it ruptures. Internal (inside) hemorrhoids develop within the anus beneath the lining. Painless bleeding and protrusion during bowel movements are the most common symptom. However, an internal hemorrhoid can cause severe pain if it is completely "prolapsed" - protrudes from the anal opening and cannot be pushed back inside.   What causes hemorrhoids? An exact cause is unknown; however, the upright posture of humans alone forces a great deal of pressure on the rectal veins, which sometimes causes them to bulge. Other contributing factors include:  . Aging  . Chronic constipation or diarrhea  . Pregnancy  . Heredity  . Straining during bowel movements  . Faulty bowel function due to overuse of laxatives or enemas . Spending long periods of time (e.g., reading) on the toilet  Whatever the cause, the tissues supporting the vessels stretch. As a result, the vessels dilate; their walls become thin and bleed. If the stretching and pressure continue, the weakened vessels protrude.  What are the  symptoms? If you notice any of the following, you could have hemorrhoids:  . Bleeding during bowel movements  . Protrusion during bowel movements . Itching in the anal area  . Pain  . Sensitive lump(s)  How are hemorrhoids treated? Mild symptoms can be relieved frequently by increasing the amount of fiber (e.g., fruits, vegetables, breads and cereals) and fluids in the diet. Eliminating excessive straining reduces the pressure on hemorrhoids and helps prevent them from protruding. A sitz bath - sitting in plain warm water for about 10 minutes - can also provide some relief . With these measures, the pain and swelling of most symptomatic hemorrhoids will decrease in two to seven days, and the firm lump should recede within four to six weeks. In cases of severe or persistent pain from a thrombosed hemorrhoid, your physician may elect to remove the hemorrhoid containing the clot with a small incision. Performed under local anesthesia as an outpatient, this procedure generally provides relief. Severe hemorrhoids may require special treatment, much of which can be performed on an outpatient basis.  . Ligation - the rubber band treatment - works effectively on internal hemorrhoids that protrude with bowel movements. A small rubber band is placed over the hemorrhoid, cutting off its blood supply. The hemorrhoid and the band fall off in a few days and the wound usually heals in a week or two. This procedure sometimes produces mild discomfort and bleeding and may need to be repeated for a full effect.  There is a more intense version of this procedure that is done in the OR as outpatient surgery called THD.  It involves identifying blood vessels leading to the   hemorrhoids and then tying them off with sutures.  This method is a little more painful than rubber band ligation but less painful than traditional hemorrhoidectomy and usually does not have to be repeated.  It is best for internal hemorrhoids that  bleed.  Rubber Band Ligation of Internal Hemorrhoids:  A.  Bulging, bleeding, internal hemorrhoid B.  Rubber band applied at the base of the hemorrhoid C.  About 7 days later, the banded hemorrhoid has fallen off leaving a small scar (arrow)  . Injection and Coagulation can also be used on bleeding hemorrhoids that do not protrude. Both methods are relatively painless and cause the hemorrhoid to shrivel up. . Hemorrhoidectomy - surgery to remove the hemorrhoids - is the most complete method for removal of internal and external hemorrhoids. It is necessary when (1) clots repeatedly form in external hemorrhoids; (2) ligation fails to treat internal hemorrhoids; (3) the protruding hemorrhoid cannot be reduced; or (4) there is persistent bleeding. A hemorrhoidectomy removes excessive tissue that causes the bleeding and protrusion. It is done under anesthesia using sutures, and may, depending upon circumstances, require hospitalization and a period of inactivity. Laser hemorrhoidectomies do not offer any advantage over standard operative techniques. They are also quite expensive, and contrary to popular belief, are no less painful.  Do hemorrhoids lead to cancer? No. There is no relationship between hemorrhoids and cancer. However, the symptoms of hemorrhoids, particularly bleeding, are similar to those of colorectal cancer and other diseases of the digestive system. Therefore, it is important that all symptoms are investigated by a physician specially trained in treating diseases of the colon and rectum and that everyone 45 years or older undergo screening tests for colorectal cancer. Do not rely on over-the-counter medications or other self-treatments. See a colorectal surgeon first so your symptoms can be properly evaluated and effective treatment prescribed.  2012 American Society of Colon & Rectal Sur[employer]      GETTING TO GOOD BOWEL HEALTH. Irregular bowel habits such as constipation can lead  to many problems over time.  Having one soft bowel movement a day is the most important way to prevent further problems.  The anorectal canal is designed to handle stretching and feces to safely manage our ability to get rid of solid waste (feces, poop, stool) out of our body.  BUT, hard constipated stools can act like ripping concrete bricks causing inflamed hemorrhoids, anal fissures, abdominal pain and bloating.     The goal: ONE SOFT BOWEL MOVEMENT A DAY!  To have soft, regular bowel movements:    Drink at least 8 tall glasses of water a day.     Take plenty of fiber.  Fiber is the undigested part of plant food that passes into the colon, acting s "natures broom" to encourage bowel motility and movement.  Fiber can absorb and hold large amounts of water. This results in a larger, bulkier stool, which is soft and easier to pass. Work gradually over several weeks up to 6 servings a day of fiber (25g a day even more if needed) in the form of: o Vegetables -- Root (potatoes, carrots, turnips), leafy green (lettuce, salad greens, celery, spinach), or cooked high residue (cabbage, broccoli, etc) o Fruit -- Fresh (unpeeled skin & pulp), Dried (prunes, apricots, cherries, etc ),  or stewed ( applesauce)  o Whole grain breads, pasta, etc (whole wheat)  o Bran cereals    Bulking Agents -- This type of water-retaining fiber generally is easily obtained each day by one   of the following:  o Psyllium bran -- The psyllium plant is remarkable because its ground seeds can retain so much water. This product is available as Metamucil, Konsyl, Effersyllium, Per Diem Fiber, or the less expensive generic preparation in drug and health food stores. Although labeled a laxative, it really is not a laxative.  o Methylcellulose -- This is another fiber derived from wood which also retains water. It is available as Citrucel. o Polyethylene Glycol - and "artificial" fiber commonly called Miralax or Glycolax.  It is helpful for  people with gassy or bloated feelings with regular fiber o Flax Seed - a less gassy fiber than psyllium   No reading or other relaxing activity while on the toilet. If bowel movements take longer than 5 minutes, you are too constipated   AVOID CONSTIPATION.  High fiber and water intake usually takes care of this.  Sometimes a laxative is needed to stimulate more frequent bowel movements, but    Laxatives are not a good long-term solution as it can wear the colon out. o Osmotics (Milk of Magnesia, Fleets phosphosoda, Magnesium citrate, MiraLax, GoLytely) are safer than  o Stimulants (Senokot, Castor Oil, Dulcolax, Ex Lax)    o Do not take laxatives for more than 7days in a row.    IF SEVERELY CONSTIPATED, try a Bowel Retraining Program: o Do not use laxatives.  o Eat a diet high in roughage, such as bran cereals and leafy vegetables.  o Drink six (6) ounces of prune or apricot juice each morning.  o Eat two (2) large servings of stewed fruit each day.  o Take one (1) heaping tablespoon of a psyllium-based bulking agent twice a day. Use sugar-free sweetener when possible to avoid excessive calories.  o Eat a normal breakfast.  o Set aside 15 minutes after breakfast to sit on the toilet, but do not strain to have a bowel movement.  o If you do not have a bowel movement by the third day, use an enema and repeat the above steps.    

## 2014-02-01 NOTE — Progress Notes (Signed)
Chief Complaint  Patient presents with  . Rectal Problems    HISTORY: Valerie Jones is a 43 y.o. female who presents to the office with anal pain.  Other symptoms include occasional bleeding, burning.  This had been occurring for about a year.  She has tried prep H and sitz in the past with good success.  Constipation makes the symptoms worse.   It is intermittent in nature.  Her bowel habits are regular and her bowel movements are occasionally.  Her fiber intake is dietary.  She has never had a colonoscopy.       History reviewed. No pertinent past medical history.    Past Surgical History  Procedure Laterality Date  . Appendectomy  1988  . Intrauterine device insertion  10/13/2011    MIRENA        Current Outpatient Prescriptions  Medication Sig Dispense Refill  . hydrocortisone (ANUSOL-HC) 2.5 % rectal cream Place 1 application rectally 2 (two) times daily.  30 g  0   Current Facility-Administered Medications  Medication Dose Route Frequency Provider Last Rate Last Dose  . levonorgestrel (MIRENA) 20 MCG/24HR IUD   Intrauterine Once Anastasio Auerbach, MD          Allergies  Allergen Reactions  . Clindamycin/Lincomycin Hives  . Sulfa Antibiotics Hives      Family History  Problem Relation Age of Onset  . Hypertension Father   . Cancer Maternal Grandfather     Non Hodgkins Lymphoma    History   Social History  . Marital Status: Married    Spouse Name: N/A    Number of Children: N/A  . Years of Education: N/A   Social History Main Topics  . Smoking status: Never Smoker   . Smokeless tobacco: Never Used  . Alcohol Use: Yes     Comment: 2 NIGHTS A WEEK ... WINE  . Drug Use: No  . Sexual Activity: Yes    Birth Control/ Protection: IUD     Comment: Mirena inserted 10-13-11   Other Topics Concern  . None   Social History Narrative  . None      REVIEW OF SYSTEMS - PERTINENT POSITIVES ONLY: Review of Systems - General ROS: negative for - chills, fever or  weight loss Hematological and Lymphatic ROS: negative for - bleeding problems, blood clots or bruising Respiratory ROS: no cough, shortness of breath, or wheezing Cardiovascular ROS: no chest pain or dyspnea on exertion Gastrointestinal ROS: positive for - abdominal pain and epigastric negative for - blood in stools or change in bowel habits Genito-Urinary ROS: no dysuria, trouble voiding, or hematuria  EXAM: Filed Vitals:   02/01/14 1134  BP: 90/64  Pulse: 72  Temp: 98.6 F (37 C)  Resp: 16    General appearance: alert and cooperative Resp: clear to auscultation bilaterally Cardio: regular rate and rhythm GI: normal findings: soft, non-tender   Procedure: Anoscopy Surgeon: Marcello Moores Diagnosis: anal pain  Assistant: McPherson After the risks and benefits were explained, verbal consent was obtained for above procedure  Anesthesia: none Findings: moderate posterior, R anterior skin tags with small thrombosis noted, grade 1 internal hemorrhoids    ASSESSMENT AND PLAN: Valerie Jones is a 43 y.o. F with anal pain, which appears to be related to inflammation of her external hemorrhoids.  The only surgical option would be standard hemorrhoidectomy.  I have recommened medical treatment with a fiber supplement and anusol prn.  If her symptoms worsen, she will call the office to discuss surgery  further.      Rosario Adie, MD Colon and Rectal Surgery / [facility]      Visit Diagnoses: 1. External hemorrhoids     Primary Care Physician: Anastasio Auerbach, MD

## 2014-05-14 ENCOUNTER — Encounter: Payer: Self-pay | Admitting: Emergency Medicine

## 2014-05-14 ENCOUNTER — Emergency Department
Admission: EM | Admit: 2014-05-14 | Discharge: 2014-05-14 | Disposition: A | Discharge status: Home / Self Care | Payer: 59 | Source: Home / Self Care | Attending: Family Medicine | Admitting: Family Medicine

## 2014-05-14 DIAGNOSIS — L255 Unspecified contact dermatitis due to plants, except food: Secondary | ICD-10-CM

## 2014-05-14 MED ORDER — PREDNISONE 20 MG PO TABS: 20.0000 mg | ORAL_TABLET | Freq: Two times a day (BID) | ORAL | Status: DC

## 2014-05-14 MED ORDER — METHYLPREDNISOLONE SODIUM SUCC 125 MG IJ SOLR
80.0000 mg | Freq: Once | INTRAMUSCULAR | Status: AC
  Administered 2014-05-14: 80 mg via INTRAMUSCULAR

## 2014-05-14 NOTE — ED Provider Notes (Signed)
CSN: [id number]     Arrival date & time 05/14/14  1507 History   First MD Initiated Contact with Patient 05/14/14 1528     Chief Complaint  Patient presents with  . Poison Ivy      HPI Comments: Patient worked in yard yesterday and came into contact with poison ivy, and now has pruritic rash on face, arms, and legs.                               Patient is a 43 y.o. female presenting with rash. The history is provided by the patient.  Rash Pain location: face, arms, and legs. Pain quality comment:  Itching Pain severity:  Mild Onset quality:  Gradual Duration:  1 day Timing:  Constant Progression:  Worsening Chronicity:  New Context comment:  Poison ivy contact Relieved by:  Nothing Worsened by:  Nothing tried Ineffective treatments:  None tried Associated symptoms: no chills, no fatigue, no fever, no nausea and no sore throat     History reviewed. No pertinent past medical history. Past Surgical History  Procedure Laterality Date  . Appendectomy  1988  . Intrauterine device insertion  10/13/2011    MIRENA   Family History  Problem Relation Age of Onset  . Hypertension Father   . Cancer Maternal Grandfather     Non Hodgkins Lymphoma   History  Substance Use Topics  . Smoking status: Never Smoker   . Smokeless tobacco: Never Used  . Alcohol Use: Yes     Comment: 2 NIGHTS A WEEK ... WINE   OB History   Grav Para Term Preterm Abortions TAB SAB Ect Mult Living   2 2 2       2      Review of Systems  Constitutional: Negative for fever, chills and fatigue.  HENT: Negative for sore throat.   Gastrointestinal: Negative for nausea.  Skin: Positive for rash.  All other systems reviewed and are negative.   Allergies  Clindamycin/lincomycin and Sulfa antibiotics  Home Medications   Prior to Admission medications   Medication Sig Start Date End Date Taking? Authorizing Provider  hydrocortisone (ANUSOL-HC) 2.5 % rectal cream Place 1 application rectally 2 (two) times  daily. 3/38/25   Leighton Ruff, MD  predniSONE (DELTASONE) 20 MG tablet Take 1 tablet (20 mg total) by mouth 2 (two) times daily. Take with food.  Begin 05/15/14. 05/14/14   Kandra Nicolas, MD   BP 109/69  Pulse 54  Temp(Src) 98.2 F (36.8 C) (Oral)  Resp 14  Wt 128 lb (58.06 kg)  SpO2 100% Physical Exam  Nursing note and vitals reviewed. Constitutional: She is oriented to person, place, and time. She appears well-developed and well-nourished. No distress.  HENT:  Head: Normocephalic.    Mouth/Throat: Oropharynx is clear and moist.  Eyes: Conjunctivae are normal. Pupils are equal, round, and reactive to light.  Neurological: She is alert and oriented to person, place, and time.  Skin: Skin is warm and dry. Rash noted.     On arms, legs, and face are scattered erythematous macules and linear lesions.  Minimal swelling of face.    ED Course  Procedures  none      MDM   1. Rhus dermatitis    Solumedrol 80mg .IM.  Begin prednisone burst tomorrow. May take Benadryl at bedtime for itching. Followup with Family Doctor if not improved in about 5 days.    Kandra Nicolas,  MD 05/14/14 1711

## 2014-05-14 NOTE — ED Notes (Signed)
Poison ivy rash to face, nearing eyes, arms and legs x 24 hours.

## 2014-05-14 NOTE — Discharge Instructions (Signed)
May take Benadryl at bedtime for itching.   Poison [employer] ivy is a inflammation of the skin (contact dermatitis) caused by touching the allergens on the leaves of the ivy plant following previous exposure to the plant. The rash usually appears 48 hours after exposure. The rash is usually bumps (papules) or blisters (vesicles) in a linear pattern. Depending on your own sensitivity, the rash may simply cause redness and itching, or it may also progress to blisters which may break open. These must be well cared for to prevent secondary bacterial (germ) infection, followed by scarring. Keep any open areas dry, clean, dressed, and covered with an antibacterial ointment if needed. The eyes may also get puffy. The puffiness is worst in the morning and gets better as the day progresses. This dermatitis usually heals without scarring, within 2 to 3 weeks without treatment. HOME CARE INSTRUCTIONS  Thoroughly wash with soap and water as soon as you have been exposed to poison ivy. You have about one half hour to remove the plant resin before it will cause the rash. This washing will destroy the oil or antigen on the skin that is causing, or will cause, the rash. Be sure to wash under your fingernails as any plant resin there will continue to spread the rash. Do not rub skin vigorously when washing affected area. Poison ivy cannot spread if no oil from the plant remains on your body. A rash that has progressed to weeping sores will not spread the rash unless you have not washed thoroughly. It is also important to wash any clothes you have been wearing as these may carry active allergens. The rash will return if you wear the unwashed clothing, even several days later. Avoidance of the plant in the future is the best measure. Poison ivy plant can be recognized by the number of leaves. Generally, poison ivy has three leaves with flowering branches on a single stem. Diphenhydramine may be purchased over the counter and  used as needed for itching. Do not drive with this medication if it makes you drowsy.Ask your caregiver about medication for children. SEEK MEDICAL CARE IF:  Open sores develop.  Redness spreads beyond area of rash.  You notice purulent (pus-like) discharge.  You have increased pain.  Other signs of infection develop (such as fever). Document Released: 11/27/2000 Document Revised: 02/22/2012 Document Reviewed: 10/16/2009 [facility] Patient Information 2014 [place], [place].

## 2014-05-23 ENCOUNTER — Emergency Department (HOSPITAL_BASED_OUTPATIENT_CLINIC_OR_DEPARTMENT_OTHER)
Admission: EM | Admit: 2014-05-23 | Discharge: 2014-05-24 | Disposition: A | Discharge status: Home / Self Care | Payer: 59 | Attending: Emergency Medicine | Admitting: Emergency Medicine

## 2014-05-23 ENCOUNTER — Encounter (HOSPITAL_BASED_OUTPATIENT_CLINIC_OR_DEPARTMENT_OTHER): Payer: Self-pay | Admitting: Emergency Medicine

## 2014-05-23 DIAGNOSIS — R197 Diarrhea, unspecified: Secondary | ICD-10-CM | POA: Insufficient documentation

## 2014-05-23 DIAGNOSIS — R112 Nausea with vomiting, unspecified: Secondary | ICD-10-CM

## 2014-05-23 DIAGNOSIS — IMO0002 Reserved for concepts with insufficient information to code with codable children: Secondary | ICD-10-CM | POA: Insufficient documentation

## 2014-05-23 DIAGNOSIS — R42 Dizziness and giddiness: Secondary | ICD-10-CM | POA: Insufficient documentation

## 2014-05-23 MED ORDER — ONDANSETRON HCL 4 MG/2ML IJ SOLN
4.0000 mg | Freq: Once | INTRAMUSCULAR | Status: AC
  Administered 2014-05-23: 4 mg via INTRAVENOUS

## 2014-05-23 MED ORDER — ONDANSETRON HCL 4 MG/2ML IJ SOLN
INTRAMUSCULAR | Status: AC
  Administered 2014-05-23: 4 mg via INTRAVENOUS
  Filled 2014-05-23: qty 2

## 2014-05-23 MED ORDER — SODIUM CHLORIDE 0.9 % IV BOLUS (SEPSIS)
1000.0000 mL | Freq: Once | INTRAVENOUS | Status: AC
  Administered 2014-05-23: 1000 mL via INTRAVENOUS

## 2014-05-23 NOTE — ED Notes (Signed)
Pt c/o vomiting  X 6 hrs  Pt now taking prednisone for poison ivy .

## 2014-05-23 NOTE — ED Notes (Signed)
MD at bedside. 

## 2014-05-23 NOTE — ED Provider Notes (Signed)
CSN: [id number]     Arrival date & time 05/23/14  2158 History  This chart was scribed for Valerie Fines, MD by Randa Evens, ED Scribe. This patient was seen in room MH05/MH05 and the patient's care was started at 11:49 PM.    Chief Complaint  Patient presents with  . Vomiting    The history is provided by the patient. No language interpreter was used.   HPI Comments: Valerie Jones is a 43 y.o. female who presents to the Emergency Department complaining of vomiting onset today. States she has associated nausea, diarrhea and light headedness.  She has been taking prednisone for poison ivy treatment, and is on day 10 of a taper. She called her dermatologist who recommended taking Tagamet; she took 400mg  of Tagamet this evening without relief. She denies fever, abdominal pain, dysuria, or excessive urination.  History reviewed. No pertinent past medical history. Past Surgical History  Procedure Laterality Date  . Appendectomy  1988  . Intrauterine device insertion  10/13/2011    MIRENA   Family History  Problem Relation Age of Onset  . Hypertension Father   . Cancer Maternal Grandfather     Non Hodgkins Lymphoma   History  Substance Use Topics  . Smoking status: Never Smoker   . Smokeless tobacco: Never Used  . Alcohol Use: Yes     Comment: 2 NIGHTS A WEEK ... WINE   OB History   Grav Para Term Preterm Abortions TAB SAB Ect Mult Living   2 2 2       2      Review of Systems  Constitutional: Negative for fatigue.  Gastrointestinal: Positive for nausea, vomiting and diarrhea. Negative for abdominal pain.  Genitourinary: Negative for dysuria.  Neurological: Positive for light-headedness.    Allergies  Clindamycin/lincomycin and Sulfa antibiotics  Home Medications   Prior to Admission medications   Medication Sig Start Date End Date Taking? Authorizing Provider  hydrocortisone (ANUSOL-HC) 2.5 % rectal cream Place 1 application rectally 2 (two) times daily. 01/12/85    Leighton Ruff, MD  predniSONE (DELTASONE) 20 MG tablet Take 1 tablet (20 mg total) by mouth 2 (two) times daily. Take with food.  Begin 05/15/14. 05/14/14   Kandra Nicolas, MD   Triage Vitals: BP 108/79  Pulse 50  Temp(Src) 97.7 F (36.5 C) (Oral)  Resp 16  Ht 5\' 4"  (1.626 m)  Wt 127 lb (57.607 kg)  BMI 21.79 kg/m2  SpO2 100%  Physical Exam  Nursing note and vitals reviewed. General: Well-developed, well-nourished female in no acute distress; appearance consistent with age of record HENT: normocephalic; atraumatic Eyes: pupils equal, round and reactive to light; extraocular muscles intact Neck: supple Heart: regular rate and rhythm Lungs: clear to auscultation bilaterally Abdomen: soft; nondistended; nontender; no masses or hepatosplenomegaly; bowel sounds present Extremities: No deformity; full range of motion; pulses normal Neurologic: Awake, alert and oriented; motor function intact in all extremities and symmetric; no facial droop Skin: Warm and dry; healing rash of face and extremities Psychiatric: Normal mood and affect  ED Course  Procedures (including critical care time) DIAGNOSTIC STUDIES: Oxygen Saturation is 100% on RA, normal by my interpretation.    COORDINATION OF CARE: 11:55 AM-Discussed treatment plan which includes CBG and Zofran with pt at bedside and pt agreed to plan.    MDM  Heart rate of 50 is consistent with heart rate noted on multiple previous visits  Nursing notes and vitals signs, including pulse oximetry, reviewed.  Summary of  this visit's results, reviewed by myself:  Labs:  Results for orders placed during the hospital encounter of 05/23/14 (from the past 24 hour(s))  CBG MONITORING, ED     Status: None   Collection Time    05/24/14 12:04 AM      Result Value Ref Range   Glucose-Capillary 84  70 - 99 mg/dL   2:56 AM Patient feeling much better after IV fluids and Zofran. She is 10 days into her course of prednisone. It seems unusual that  her symptoms would occur this late in the course but she had a similar occurrence with steroids a year ago. We will treat her with Zofran and she has 4 more days of prednisone tapered ago. She was advised to contact her dermatologist regarding the remaining doses.   I personally performed the services described in this documentation, which was scribed in my presence. The recorded information has been reviewed and is accurate.    Valerie Fines, MD 05/24/14 [phone]

## 2014-05-24 LAB — CBG MONITORING, ED: Glucose-Capillary: 84 mg/dL (ref 70–99)

## 2014-05-24 MED ORDER — ONDANSETRON 8 MG PO TBDP: 8.0000 mg | ORAL_TABLET | Freq: Three times a day (TID) | ORAL | Status: DC | PRN

## 2014-05-24 MED ORDER — SODIUM CHLORIDE 0.9 % IV BOLUS (SEPSIS)
1000.0000 mL | Freq: Once | INTRAVENOUS | Status: AC
  Administered 2014-05-24: 1000 mL via INTRAVENOUS

## 2014-05-24 MED ORDER — ACETAMINOPHEN 325 MG PO TABS
650.0000 mg | ORAL_TABLET | Freq: Once | ORAL | Status: AC
  Administered 2014-05-24: 650 mg via ORAL
  Filled 2014-05-24: qty 2

## 2014-05-24 NOTE — ED Notes (Signed)
MD at bedside. 

## 2014-05-24 NOTE — ED Notes (Signed)
CBG obtained per RN Rod Holler

## 2014-09-17 ENCOUNTER — Other Ambulatory Visit: Payer: Self-pay

## 2014-09-17 DIAGNOSIS — Z1231 Encounter for screening mammogram for malignant neoplasm of breast: Secondary | ICD-10-CM

## 2014-09-24 ENCOUNTER — Ambulatory Visit
Admission: RE | Admit: 2014-09-24 | Discharge: 2014-09-24 | Disposition: A | Discharge status: Home / Self Care | Payer: 59 | Source: Ambulatory Visit

## 2014-09-24 DIAGNOSIS — Z1231 Encounter for screening mammogram for malignant neoplasm of breast: Secondary | ICD-10-CM

## 2014-10-09 ENCOUNTER — Telehealth: Payer: Self-pay | Admitting: *Deleted

## 2014-10-09 NOTE — Telephone Encounter (Signed)
okay

## 2014-10-09 NOTE — Telephone Encounter (Signed)
Pt called asking if you would be will to give referral a neuro rehab for Vestibular? Pt has no PCP and c/o positional vertigo. Please advise

## 2014-10-11 NOTE — Telephone Encounter (Signed)
Referral form will be faxed, I left on pt voicemail this has been done.

## 2014-10-15 ENCOUNTER — Encounter (HOSPITAL_BASED_OUTPATIENT_CLINIC_OR_DEPARTMENT_OTHER): Payer: Self-pay | Admitting: Emergency Medicine

## 2014-10-18 ENCOUNTER — Ambulatory Visit: Payer: 59 | Admitting: Family Medicine

## 2014-11-13 ENCOUNTER — Encounter: Payer: Self-pay | Admitting: Physical Therapy

## 2014-11-13 ENCOUNTER — Ambulatory Visit: Payer: 59 | Attending: Gynecology | Admitting: Physical Therapy

## 2014-11-13 DIAGNOSIS — R42 Dizziness and giddiness: Secondary | ICD-10-CM | POA: Insufficient documentation

## 2014-11-13 NOTE — Therapy (Signed)
[facility] [address] [place], [place], [address] Phone: [phone]   Fax:  [phone]  Physical Therapy Evaluation  Patient Details  Name: Anarely Nicholls MRN: [id number] Date of Birth: 1971-06-17  Encounter Date: 11/13/2014      PT End of Session - 11/13/14 1657    Visit Number 1   Number of Visits 4   Date for PT Re-Evaluation 12/14/14   Authorization Type [employer]   Authorization - Visit Number 0   PT Start Time 3818   PT Stop Time 1153   PT Time Calculation (min) 50 min   Activity Tolerance Other (comment)  c/o wooziness and not feeling after SOT      History reviewed. No pertinent past medical history.  Past Surgical History  Procedure Laterality Date  . Appendectomy  1988  . Intrauterine device insertion  10/13/2011    [facility]    There were no vitals taken for this visit.  Visit Diagnosis:  Dizziness and giddiness - Plan: PT plan of care cert/re-cert      Subjective Assessment - 11/13/14 1110    Symptoms Pt. reports symptoms started about 3 years ago while bending over to clean closet; visual disturbance started about 1 month ago  light-headedness; some tinnitus but doesn't correlate with vertigo episode   Currently in Pain? No/denies              PT Education - 11/13/14 1656    Education provided Yes   Education Details instructed pt. in gaze stabilization on floor, progressing to patterned background then to foam surface; tips on gaze staiblization infor given; habituation exercise for movements that provoke vertigo   Person(s) Educated Patient   Methods Explanation;Demonstration;Handout   Comprehension Verbalized understanding;Returned demonstration            PT Long Term Goals - 11/13/14 1705    PT LONG TERM GOAL #1   Title Independent in HEP for vestibular exercises.   Baseline 12-14-14   Time 4   Period Weeks   Status New   PT LONG TERM GOAL #2   Title Improve Dizziness Handicap Index  score to </= 14/100 for demo of improved symtpoms.   Baseline 12-14-14; [place] = 24/100 = mild handicap group   Time 4   Period Weeks   Status New   PT LONG TERM GOAL #3   Title Subjectively report less vertigo after completion of dynamic visual acuity testing to demo improved VOR function.   Baseline 12-14-14   Time 4   Period Weeks   Status New          Plan - 11/13/14 1700    Clinical Impression Statement Pt. presents with vestibular dysfunction with symptoms consistent with motion sensitivity; appears to be provoked by visual stimulation/busy visual and conflicting visual input increases symptoms;  DVA WNL's for test results however, symptoms provoked with this testing; SOT results WNL's but symptoms also provoked with this testing   Pt will benefit from skilled therapeutic intervention in order to improve on the following deficits Other (comment)  vestibular deficits   Rehab Potential Good   PT Frequency 1x / week   PT Duration 4 weeks   PT Treatment/Interventions Therapeutic activities;Patient/family education;Therapeutic exercise;Balance training;Gait training;Neuromuscular re-education  vestibular rehab exercises   PT Next Visit Plan give balance on foam exercises; check HEP   PT Home Exercise Plan gaze stabilization, habituation, balance on foam   Consulted and Agree with Plan of Care Patient  Vestibular Assessment - 11/13/14 1647    General Observation pt. is a 43 year old female with c/o intermittent vertigo/lightheadedness/wooziness that has occurred intermittently with prolonged positioning of bending over to clean out a closet and then reoccurred about a month ago after drinking some alcohol one evening and awakening next morning with vertigo ; this most recent episode was characterized by some visual disturbance   Type of Dizziness "Horizon moves"   Frequency of Dizziness varies   Aggravating Factors Turning head sideways;Forward bending   Relieving  Factors Avoiding busy/distracting areas  avoiding head movements and bending over for prolonged time    Occulomotor Alignment Normal   Smooth Pursuits Intact   Saccades Intact   VOR 1 Head Only (x 1 viewing) provoked symptoms   Dynamic 1 line difference (WNL's)   Sidelying Test Sidelying Right   Sidelying Right Duration no vertigo   Sidelying Left Duration no vertigo   Therapist, occupational Comment composite score 75/100 is WNL's with N= 70/100; somtasensory, visual, and vestibular inputs are all WNL's for pt.'s age population; no falls occurred onany trials of SOT   Sit to Supine No dizziness   Supine to Left Side No dizziness   Supine to Right Side No dizziness   Head Turning x 5 Moderate dizziness   Rolling Right No dizziness   Rolling Left No dizziness   Positional Sensitivities Comments bending over for prolonged periods of time provoke vertigo      FOTO score - pt.'s physical FS primary measure score = 90/100 (higher # greater function); like-patients score = 58/100  Dizziness Handicap Index score = 24/100 (mild handicap group)                 Problem List Patient Active Problem List   Diagnosis Date Noted  . External hemorrhoids 02/01/2014    Valerie Jones 11/13/2014, 5:14 PM   Guido Sander, [place] [address] [place], [place] [address] [phone]

## 2014-11-13 NOTE — Patient Instructions (Signed)
Gaze Stabilization: Standing Feet Apart   Feet shoulder width apart, keeping eyes on target on wall __8__ feet away, tilt head down 15-30 and move head side to side for _60___ seconds. Repeat while moving head up and down for ___60_ seconds. Do _3-5___ sessions per day. Repeat using target on pattern background.  Copyright  [employer]. All rights reserved.

## 2014-12-17 ENCOUNTER — Ambulatory Visit: Payer: 59 | Admitting: Physical Therapy

## 2015-03-06 ENCOUNTER — Telehealth: Payer: Self-pay | Admitting: *Deleted

## 2015-03-06 NOTE — Telephone Encounter (Signed)
Unable to reach patient at time of Pre-Visit Call.  Left voicemail for patient to return call when available.

## 2015-03-07 ENCOUNTER — Encounter: Payer: Self-pay | Admitting: *Deleted

## 2015-03-07 ENCOUNTER — Ambulatory Visit (INDEPENDENT_AMBULATORY_CARE_PROVIDER_SITE_OTHER): Payer: 59 | Admitting: Family Medicine

## 2015-03-07 ENCOUNTER — Encounter: Payer: Self-pay | Admitting: Family Medicine

## 2015-03-07 VITALS — BP 105/70 | HR 50 | Temp 98.0°F | Resp 16 | Ht 64.0 in | Wt 133.1 lb

## 2015-03-07 DIAGNOSIS — Z0000 Encounter for general adult medical examination without abnormal findings: Secondary | ICD-10-CM | POA: Diagnosis not present

## 2015-03-07 DIAGNOSIS — K644 Residual hemorrhoidal skin tags: Secondary | ICD-10-CM

## 2015-03-07 DIAGNOSIS — K648 Other hemorrhoids: Secondary | ICD-10-CM

## 2015-03-07 DIAGNOSIS — Z872 Personal history of diseases of the skin and subcutaneous tissue: Secondary | ICD-10-CM | POA: Diagnosis not present

## 2015-03-07 MED ORDER — TRIAMCINOLONE ACETONIDE 0.1 % EX CREA: 1.0000 "application " | TOPICAL_CREAM | Freq: Two times a day (BID) | CUTANEOUS | Status: DC

## 2015-03-07 MED ORDER — HYDROCORTISONE 2.5 % RE CREA: 1.0000 "application " | TOPICAL_CREAM | Freq: Two times a day (BID) | RECTAL | Status: DC

## 2015-03-07 MED ORDER — METHYLPREDNISOLONE (PAK) 4 MG PO TABS: ORAL_TABLET | ORAL | Status: DC

## 2015-03-07 NOTE — Addendum Note (Signed)
Addended by: Leticia Penna A on: 03/07/2015 09:19 AM   Modules accepted: Orders, Medications

## 2015-03-07 NOTE — Telephone Encounter (Signed)
Pre-Visit Call completed with patient and chart updated.   Pre-Visit Info documented in Specialty Comments under [employer].    

## 2015-03-07 NOTE — Progress Notes (Signed)
Pre visit review using our clinic review tool, if applicable. No additional management support is needed unless otherwise documented below in the visit note. 

## 2015-03-07 NOTE — Patient Instructions (Addendum)
Probiotics such as [employer] or [employer] or online NOW company 10 strain probiotic on [email or web address]  Metmamucil/Psyllium daily for colon health  Witch Hazel astringent as needed   Try Ginger caps or tea for nausea  Dawn dish liquid  Start Zyrtec twice daily and Benadryl at night  Preventive Care for Adults A healthy lifestyle and preventive care can promote health and wellness. Preventive health guidelines for women include the following key practices.  A routine yearly physical is a good way to check with your health care provider about your health and preventive screening. It is a chance to share any concerns and updates on your health and to receive a thorough exam.  Visit your dentist for a routine exam and preventive care every 6 months. Brush your teeth twice a day and floss once a day. Good oral hygiene prevents tooth decay and gum disease.  The frequency of eye exams is based on your age, health, family medical history, use of contact lenses, and other factors. Follow your health care provider's recommendations for frequency of eye exams.  Eat a healthy diet. Foods like vegetables, fruits, whole grains, low-fat dairy products, and lean protein foods contain the nutrients you need without too many calories. Decrease your intake of foods high in solid fats, added sugars, and salt. Eat the right amount of calories for you.Get information about a proper diet from your health care provider, if necessary.  Regular physical exercise is one of the most important things you can do for your health. Most adults should get at least 150 minutes of moderate-intensity exercise (any activity that increases your heart rate and causes you to sweat) each week. In addition, most adults need muscle-strengthening exercises on 2 or more days a week.  Maintain a healthy weight. The body mass index (BMI) is a screening tool to identify possible weight problems. It provides an  estimate of body fat based on height and weight. Your health care provider can find your BMI and can help you achieve or maintain a healthy weight.For adults 20 years and older:  A BMI below 18.5 is considered underweight.  A BMI of 18.5 to 24.9 is normal.  A BMI of 25 to 29.9 is considered overweight.  A BMI of 30 and above is considered obese.  Maintain normal blood lipids and cholesterol levels by exercising and minimizing your intake of saturated fat. Eat a balanced diet with plenty of fruit and vegetables. Blood tests for lipids and cholesterol should begin at age 73 and be repeated every 5 years. If your lipid or cholesterol levels are high, you are over 50, or you are at high risk for heart disease, you may need your cholesterol levels checked more frequently.Ongoing high lipid and cholesterol levels should be treated with medicines if diet and exercise are not working.  If you smoke, find out from your health care provider how to quit. If you do not use tobacco, do not start.  Lung cancer screening is recommended for adults aged 15-80 years who are at high risk for developing lung cancer because of a history of smoking. A yearly low-dose CT scan of the lungs is recommended for people who have at least a 30-pack-year history of smoking and are a current smoker or have quit within the past 15 years. A pack year of smoking is smoking an average of 1 pack of cigarettes a day for 1 year (for example: 1 pack a day for 30 years or 2 packs a  day for 15 years). Yearly screening should continue until the smoker has stopped smoking for at least 15 years. Yearly screening should be stopped for people who develop a health problem that would prevent them from having lung cancer treatment.  If you are pregnant, do not drink alcohol. If you are breastfeeding, be very cautious about drinking alcohol. If you are not pregnant and choose to drink alcohol, do not have more than 1 drink per day. One drink is  considered to be 12 ounces (355 mL) of beer, 5 ounces (148 mL) of wine, or 1.5 ounces (44 mL) of liquor.  Avoid use of street drugs. Do not share needles with anyone. Ask for help if you need support or instructions about stopping the use of drugs.  High blood pressure causes heart disease and increases the risk of stroke. Your blood pressure should be checked at least every 1 to 2 years. Ongoing high blood pressure should be treated with medicines if weight loss and exercise do not work.  If you are 80-66 years old, ask your health care provider if you should take aspirin to prevent strokes.  Diabetes screening involves taking a blood sample to check your fasting blood sugar level. This should be done once every 3 years, after age 97, if you are within normal weight and without risk factors for diabetes. Testing should be considered at a younger age or be carried out more frequently if you are overweight and have at least 1 risk factor for diabetes.  Breast cancer screening is essential preventive care for women. You should practice "breast self-awareness." This means understanding the normal appearance and feel of your breasts and may include breast self-examination. Any changes detected, no matter how small, should be reported to a health care provider. Women in their 45s and 30s should have a clinical breast exam (CBE) by a health care provider as part of a regular health exam every 1 to 3 years. After age 26, women should have a CBE every year. Starting at age 83, women should consider having a mammogram (breast X-ray test) every year. Women who have a family history of breast cancer should talk to their health care provider about genetic screening. Women at a high risk of breast cancer should talk to their health care providers about having an MRI and a mammogram every year.  Breast cancer gene (BRCA)-related cancer risk assessment is recommended for women who have family members with BRCA-related  cancers. BRCA-related cancers include breast, ovarian, tubal, and peritoneal cancers. Having family members with these cancers may be associated with an increased risk for harmful changes (mutations) in the breast cancer genes BRCA1 and BRCA2. Results of the assessment will determine the need for genetic counseling and BRCA1 and BRCA2 testing.  Routine pelvic exams to screen for cancer are no longer recommended for nonpregnant women who are considered low risk for cancer of the pelvic organs (ovaries, uterus, and vagina) and who do not have symptoms. Ask your health care provider if a screening pelvic exam is right for you.  If you have had past treatment for cervical cancer or a condition that could lead to cancer, you need Pap tests and screening for cancer for at least 20 years after your treatment. If Pap tests have been discontinued, your risk factors (such as having a new sexual partner) need to be reassessed to determine if screening should be resumed. Some women have medical problems that increase the chance of getting cervical cancer. In these cases,  your health care provider may recommend more frequent screening and Pap tests.  The HPV test is an additional test that may be used for cervical cancer screening. The HPV test looks for the virus that can cause the cell changes on the cervix. The cells collected during the Pap test can be tested for HPV. The HPV test could be used to screen women aged 1 years and older, and should be used in women of any age who have unclear Pap test results. After the age of 80, women should have HPV testing at the same frequency as a Pap test.  Colorectal cancer can be detected and often prevented. Most routine colorectal cancer screening begins at the age of 82 years and continues through age 48 years. However, your health care provider may recommend screening at an earlier age if you have risk factors for colon cancer. On a yearly basis, your health care provider  may provide home test kits to check for hidden blood in the stool. Use of a small camera at the end of a tube, to directly examine the colon (sigmoidoscopy or colonoscopy), can detect the earliest forms of colorectal cancer. Talk to your health care provider about this at age 47, when routine screening begins. Direct exam of the colon should be repeated every 5-10 years through age 86 years, unless early forms of pre-cancerous polyps or small growths are found.  People who are at an increased risk for hepatitis B should be screened for this virus. You are considered at high risk for hepatitis B if:  You were born in a country where hepatitis B occurs often. Talk with your health care provider about which countries are considered high risk.  Your parents were born in a high-risk country and you have not received a shot to protect against hepatitis B (hepatitis B vaccine).  You have HIV or AIDS.  You use needles to inject street drugs.  You live with, or have sex with, someone who has hepatitis B.  You get hemodialysis treatment.  You take certain medicines for conditions like cancer, organ transplantation, and autoimmune conditions.  Hepatitis C blood testing is recommended for all people born from [birth year] through 1965 and any individual with known risks for hepatitis C.  Practice safe sex. Use condoms and avoid high-risk sexual practices to reduce the spread of sexually transmitted infections (STIs). STIs include gonorrhea, chlamydia, syphilis, trichomonas, herpes, HPV, and human immunodeficiency virus (HIV). Herpes, HIV, and HPV are viral illnesses that have no cure. They can result in disability, cancer, and death.  You should be screened for sexually transmitted illnesses (STIs) including gonorrhea and chlamydia if:  You are sexually active and are younger than 24 years.  You are older than 24 years and your health care provider tells you that you are at risk for this type of  infection.  Your sexual activity has changed since you were last screened and you are at an increased risk for chlamydia or gonorrhea. Ask your health care provider if you are at risk.  If you are at risk of being infected with HIV, it is recommended that you take a prescription medicine daily to prevent HIV infection. This is called preexposure prophylaxis (PrEP). You are considered at risk if:  You are a heterosexual woman, are sexually active, and are at increased risk for HIV infection.  You take drugs by injection.  You are sexually active with a partner who has HIV.  Talk with your health care provider about  whether you are at high risk of being infected with HIV. If you choose to begin PrEP, you should first be tested for HIV. You should then be tested every 3 months for as long as you are taking PrEP.  Osteoporosis is a disease in which the bones lose minerals and strength with aging. This can result in serious bone fractures or breaks. The risk of osteoporosis can be identified using a bone density scan. Women ages 48 years and over and women at risk for fractures or osteoporosis should discuss screening with their health care providers. Ask your health care provider whether you should take a calcium supplement or vitamin D to reduce the rate of osteoporosis.  Menopause can be associated with physical symptoms and risks. Hormone replacement therapy is available to decrease symptoms and risks. You should talk to your health care provider about whether hormone replacement therapy is right for you.  Use sunscreen. Apply sunscreen liberally and repeatedly throughout the day. You should seek shade when your shadow is shorter than you. Protect yourself by wearing long sleeves, pants, a wide-brimmed hat, and sunglasses year round, whenever you are outdoors.  Once a month, do a whole body skin exam, using a mirror to look at the skin on your back. Tell your health care provider of new moles,  moles that have irregular borders, moles that are larger than a pencil eraser, or moles that have changed in shape or color.  Stay current with required vaccines (immunizations).  Influenza vaccine. All adults should be immunized every year.  Tetanus, diphtheria, and acellular pertussis (Td, Tdap) vaccine. Pregnant women should receive 1 dose of Tdap vaccine during each pregnancy. The dose should be obtained regardless of the length of time since the last dose. Immunization is preferred during the 27th-36th week of gestation. An adult who has not previously received Tdap or who does not know her vaccine status should receive 1 dose of Tdap. This initial dose should be followed by tetanus and diphtheria toxoids (Td) booster doses every 10 years. Adults with an unknown or incomplete history of completing a 3-dose immunization series with Td-containing vaccines should begin or complete a primary immunization series including a Tdap dose. Adults should receive a Td booster every 10 years.  Varicella vaccine. An adult without evidence of immunity to varicella should receive 2 doses or a second dose if she has previously received 1 dose. Pregnant females who do not have evidence of immunity should receive the first dose after pregnancy. This first dose should be obtained before leaving the health care facility. The second dose should be obtained 4-8 weeks after the first dose.  Human papillomavirus (HPV) vaccine. Females aged 13-26 years who have not received the vaccine previously should obtain the 3-dose series. The vaccine is not recommended for use in pregnant females. However, pregnancy testing is not needed before receiving a dose. If a female is found to be pregnant after receiving a dose, no treatment is needed. In that case, the remaining doses should be delayed until after the pregnancy. Immunization is recommended for any person with an immunocompromised condition through the age of 2 years if she  did not get any or all doses earlier. During the 3-dose series, the second dose should be obtained 4-8 weeks after the first dose. The third dose should be obtained 24 weeks after the first dose and 16 weeks after the second dose.  Zoster vaccine. One dose is recommended for adults aged 17 years or older unless certain  conditions are present.  Measles, mumps, and rubella (MMR) vaccine. Adults born before [birth year] generally are considered immune to measles and mumps. Adults born in [birth year] or later should have 1 or more doses of MMR vaccine unless there is a contraindication to the vaccine or there is laboratory evidence of immunity to each of the three diseases. A routine second dose of MMR vaccine should be obtained at least 28 days after the first dose for students attending postsecondary schools, health care workers, or international travelers. People who received inactivated measles vaccine or an unknown type of measles vaccine during 1963-1967 should receive 2 doses of MMR vaccine. People who received inactivated mumps vaccine or an unknown type of mumps vaccine before 1979 and are at high risk for mumps infection should consider immunization with 2 doses of MMR vaccine. For females of childbearing age, rubella immunity should be determined. If there is no evidence of immunity, females who are not pregnant should be vaccinated. If there is no evidence of immunity, females who are pregnant should delay immunization until after pregnancy. Unvaccinated health care workers born before [birth year] who lack laboratory evidence of measles, mumps, or rubella immunity or laboratory confirmation of disease should consider measles and mumps immunization with 2 doses of MMR vaccine or rubella immunization with 1 dose of MMR vaccine.  Pneumococcal 13-valent conjugate (PCV13) vaccine. When indicated, a person who is uncertain of her immunization history and has no record of immunization should receive the PCV13 vaccine. An adult  aged 45 years or older who has certain medical conditions and has not been previously immunized should receive 1 dose of PCV13 vaccine. This PCV13 should be followed with a dose of pneumococcal polysaccharide (PPSV23) vaccine. The PPSV23 vaccine dose should be obtained at least 8 weeks after the dose of PCV13 vaccine. An adult aged 11 years or older who has certain medical conditions and previously received 1 or more doses of PPSV23 vaccine should receive 1 dose of PCV13. The PCV13 vaccine dose should be obtained 1 or more years after the last PPSV23 vaccine dose.  Pneumococcal polysaccharide (PPSV23) vaccine. When PCV13 is also indicated, PCV13 should be obtained first. All adults aged 13 years and older should be immunized. An adult younger than age 45 years who has certain medical conditions should be immunized. Any person who resides in a nursing home or long-term care facility should be immunized. An adult smoker should be immunized. People with an immunocompromised condition and certain other conditions should receive both PCV13 and PPSV23 vaccines. People with human immunodeficiency virus (HIV) infection should be immunized as soon as possible after diagnosis. Immunization during chemotherapy or radiation therapy should be avoided. Routine use of PPSV23 vaccine is not recommended for American Indians, [place] Natives, or people younger than 65 years unless there are medical conditions that require PPSV23 vaccine. When indicated, people who have unknown immunization and have no record of immunization should receive PPSV23 vaccine. One-time revaccination 5 years after the first dose of PPSV23 is recommended for people aged 19-64 years who have chronic kidney failure, nephrotic syndrome, asplenia, or immunocompromised conditions. People who received 1-2 doses of PPSV23 before age 90 years should receive another dose of PPSV23 vaccine at age 15 years or later if at least 5 years have passed since the previous  dose. Doses of PPSV23 are not needed for people immunized with PPSV23 at or after age 73 years.  Meningococcal vaccine. Adults with asplenia or persistent complement component deficiencies should receive 2 doses of quadrivalent  meningococcal conjugate (MenACWY-D) vaccine. The doses should be obtained at least 2 months apart. Microbiologists working with certain meningococcal bacteria, [employer] recruits, people at risk during an outbreak, and people who travel to or live in countries with a high rate of meningitis should be immunized. A first-year college student up through age 43 years who is living in a residence hall should receive a dose if she did not receive a dose on or after her 16th birthday. Adults who have certain high-risk conditions should receive one or more doses of vaccine.  Hepatitis A vaccine. Adults who wish to be protected from this disease, have certain high-risk conditions, work with hepatitis A-infected animals, work in hepatitis A research labs, or travel to or work in countries with a high rate of hepatitis A should be immunized. Adults who were previously unvaccinated and who anticipate close contact with an international adoptee during the first 60 days after arrival in the [place] from a country with a high rate of hepatitis A should be immunized.  Hepatitis B vaccine. Adults who wish to be protected from this disease, have certain high-risk conditions, may be exposed to blood or other infectious body fluids, are household contacts or sex partners of hepatitis B positive people, are clients or workers in certain care facilities, or travel to or work in countries with a high rate of hepatitis B should be immunized.  Haemophilus influenzae type b (Hib) vaccine. A previously unvaccinated person with asplenia or sickle cell disease or having a scheduled splenectomy should receive 1 dose of Hib vaccine. Regardless of previous immunization, a recipient of a hematopoietic stem cell  transplant should receive a 3-dose series 6-12 months after her successful transplant. Hib vaccine is not recommended for adults with HIV infection. Preventive Services / Frequency Ages 63 to 57 years  Blood pressure check.** / Every 1 to 2 years.  Lipid and cholesterol check.** / Every 5 years beginning at age 35.  Clinical breast exam.** / Every 3 years for women in their 28s and 59s.  BRCA-related cancer risk assessment.** / For women who have family members with a BRCA-related cancer (breast, ovarian, tubal, or peritoneal cancers).  Pap test.** / Every 2 years from ages 71 through 4. Every 3 years starting at age 55 through age 53 or 11 with a history of 3 consecutive normal Pap tests.  HPV screening.** / Every 3 years from ages 61 through ages 53 to 53 with a history of 3 consecutive normal Pap tests.  Hepatitis C blood test.** / For any individual with known risks for hepatitis C.  Skin self-exam. / Monthly.  Influenza vaccine. / Every year.  Tetanus, diphtheria, and acellular pertussis (Tdap, Td) vaccine.** / Consult your health care provider. Pregnant women should receive 1 dose of Tdap vaccine during each pregnancy. 1 dose of Td every 10 years.  Varicella vaccine.** / Consult your health care provider. Pregnant females who do not have evidence of immunity should receive the first dose after pregnancy.  HPV vaccine. / 3 doses over 6 months, if 28 and younger. The vaccine is not recommended for use in pregnant females. However, pregnancy testing is not needed before receiving a dose.  Measles, mumps, rubella (MMR) vaccine.** / You need at least 1 dose of MMR if you were born in 1957 or later. You may also need a 2nd dose. For females of childbearing age, rubella immunity should be determined. If there is no evidence of immunity, females who are not pregnant should be  vaccinated. If there is no evidence of immunity, females who are pregnant should delay immunization until after  pregnancy.  Pneumococcal 13-valent conjugate (PCV13) vaccine.** / Consult your health care provider.  Pneumococcal polysaccharide (PPSV23) vaccine.** / 1 to 2 doses if you smoke cigarettes or if you have certain conditions.  Meningococcal vaccine.** / 1 dose if you are age 78 to 18 years and a Market researcher living in a residence hall, or have one of several medical conditions, you need to get vaccinated against meningococcal disease. You may also need additional booster doses.  Hepatitis A vaccine.** / Consult your health care provider.  Hepatitis B vaccine.** / Consult your health care provider.  Haemophilus influenzae type b (Hib) vaccine.** / Consult your health care provider. Ages 5 to 78 years  Blood pressure check.** / Every 1 to 2 years.  Lipid and cholesterol check.** / Every 5 years beginning at age 67 years.  Lung cancer screening. / Every year if you are aged 22-80 years and have a 30-pack-year history of smoking and currently smoke or have quit within the past 15 years. Yearly screening is stopped once you have quit smoking for at least 15 years or develop a health problem that would prevent you from having lung cancer treatment.  Clinical breast exam.** / Every year after age 95 years.  BRCA-related cancer risk assessment.** / For women who have family members with a BRCA-related cancer (breast, ovarian, tubal, or peritoneal cancers).  Mammogram.** / Every year beginning at age 68 years and continuing for as long as you are in good health. Consult with your health care provider.  Pap test.** / Every 3 years starting at age 27 years through age 57 or 31 years with a history of 3 consecutive normal Pap tests.  HPV screening.** / Every 3 years from ages 91 years through ages 70 to 53 years with a history of 3 consecutive normal Pap tests.  Fecal occult blood test (FOBT) of stool. / Every year beginning at age 61 years and continuing until age 51 years. You may  not need to do this test if you get a colonoscopy every 10 years.  Flexible sigmoidoscopy or colonoscopy.** / Every 5 years for a flexible sigmoidoscopy or every 10 years for a colonoscopy beginning at age 44 years and continuing until age 69 years.  Hepatitis C blood test.** / For all people born from [birth year] through 1967 and any individual with known risks for hepatitis C.  Skin self-exam. / Monthly.  Influenza vaccine. / Every year.  Tetanus, diphtheria, and acellular pertussis (Tdap/Td) vaccine.** / Consult your health care provider. Pregnant women should receive 1 dose of Tdap vaccine during each pregnancy. 1 dose of Td every 10 years.  Varicella vaccine.** / Consult your health care provider. Pregnant females who do not have evidence of immunity should receive the first dose after pregnancy.  Zoster vaccine.** / 1 dose for adults aged 61 years or older.  Measles, mumps, rubella (MMR) vaccine.** / You need at least 1 dose of MMR if you were born in 1957 or later. You may also need a 2nd dose. For females of childbearing age, rubella immunity should be determined. If there is no evidence of immunity, females who are not pregnant should be vaccinated. If there is no evidence of immunity, females who are pregnant should delay immunization until after pregnancy.  Pneumococcal 13-valent conjugate (PCV13) vaccine.** / Consult your health care provider.  Pneumococcal polysaccharide (PPSV23) vaccine.** / 1 to 2 doses  if you smoke cigarettes or if you have certain conditions.  Meningococcal vaccine.** / Consult your health care provider.  Hepatitis A vaccine.** / Consult your health care provider.  Hepatitis B vaccine.** / Consult your health care provider.  Haemophilus influenzae type b (Hib) vaccine.** / Consult your health care provider. Ages 11 years and over  Blood pressure check.** / Every 1 to 2 years.  Lipid and cholesterol check.** / Every 5 years beginning at age 93 years.  Lung  cancer screening. / Every year if you are aged 44-80 years and have a 30-pack-year history of smoking and currently smoke or have quit within the past 15 years. Yearly screening is stopped once you have quit smoking for at least 15 years or develop a health problem that would prevent you from having lung cancer treatment.  Clinical breast exam.** / Every year after age 68 years.  BRCA-related cancer risk assessment.** / For women who have family members with a BRCA-related cancer (breast, ovarian, tubal, or peritoneal cancers).  Mammogram.** / Every year beginning at age 4 years and continuing for as long as you are in good health. Consult with your health care provider.  Pap test.** / Every 3 years starting at age 63 years through age 63 or 48 years with 3 consecutive normal Pap tests. Testing can be stopped between 65 and 70 years with 3 consecutive normal Pap tests and no abnormal Pap or HPV tests in the past 10 years.  HPV screening.** / Every 3 years from ages 62 years through ages 41 or 57 years with a history of 3 consecutive normal Pap tests. Testing can be stopped between 65 and 70 years with 3 consecutive normal Pap tests and no abnormal Pap or HPV tests in the past 10 years.  Fecal occult blood test (FOBT) of stool. / Every year beginning at age 63 years and continuing until age 53 years. You may not need to do this test if you get a colonoscopy every 10 years.  Flexible sigmoidoscopy or colonoscopy.** / Every 5 years for a flexible sigmoidoscopy or every 10 years for a colonoscopy beginning at age 9 years and continuing until age 85 years.  Hepatitis C blood test.** / For all people born from [birth year] through 1965 and any individual with known risks for hepatitis C.  Osteoporosis screening.** / A one-time screening for women ages 7 years and over and women at risk for fractures or osteoporosis.  Skin self-exam. / Monthly.  Influenza vaccine. / Every year.  Tetanus, diphtheria, and  acellular pertussis (Tdap/Td) vaccine.** / 1 dose of Td every 10 years.  Varicella vaccine.** / Consult your health care provider.  Zoster vaccine.** / 1 dose for adults aged 63 years or older.  Pneumococcal 13-valent conjugate (PCV13) vaccine.** / Consult your health care provider.  Pneumococcal polysaccharide (PPSV23) vaccine.** / 1 dose for all adults aged 66 years and older.  Meningococcal vaccine.** / Consult your health care provider.  Hepatitis A vaccine.** / Consult your health care provider.  Hepatitis B vaccine.** / Consult your health care provider.  Haemophilus influenzae type b (Hib) vaccine.** / Consult your health care provider. ** Family history and personal history of risk and conditions may change your health care provider's recommendations. Document Released: 01/26/2002 Document Revised: 04/16/2014 Document Reviewed: 04/27/2011 [facility] Patient Information 2015 [place], [place]. This information is not intended to replace advice given to you by your health care provider. Make sure you discuss any questions you have with your health care provider.  syllium daily for colon health

## 2015-03-17 ENCOUNTER — Encounter: Payer: Self-pay | Admitting: Family Medicine

## 2015-03-17 DIAGNOSIS — Z872 Personal history of diseases of the skin and subcutaneous tissue: Secondary | ICD-10-CM

## 2015-03-17 HISTORY — DX: Personal history of diseases of the skin and subcutaneous tissue: Z87.2

## 2015-03-17 NOTE — Progress Notes (Signed)
Valerie Jones  [id number] November 04, 1971 03/17/2015      Progress Note-Follow Up  Subjective  Chief Complaint  Chief Complaint  Patient presents with  . Establish Care    HPI  Patient is a 44 y.o. female in today for routine medical care. Patient is in today to establish care. Has not previously had a primary care doctor. Has routinely seen gynecology and has someone with them next month for ongoing care. Her only acute complaint is of intermittent dermatitis from poison ivy when she works in the yard. No significant flare at the moment. Has needed steroids in the past. Does have a dermatologist, Dr. Wilhemina Bonito if she needs them. No acute concerns or acute illness noted. Denies CP/palp/SOB/HA/congestion/fevers/GI or GU c/o. Taking meds as prescribed  Past Medical History  Diagnosis Date  . History of chicken pox   . GERD (gastroesophageal reflux disease)   . Hemorrhoids   . H/O atopic dermatitis 03/17/2015  . Preventative health care 03/17/2015    Past Surgical History  Procedure Laterality Date  . Intrauterine device insertion  10/13/2011    MIRENA  . Appendectomy  1988    Family History  Problem Relation Age of Onset  . Hypertension Father   . Cancer Maternal Grandfather     Non Hodgkins Lymphoma  . Hypertension Sister   . Mental illness Mother     anxiety  . Irritable bowel syndrome Mother   . COPD Maternal Grandmother     smoker  . Kidney disease Maternal Aunt     History   Social History  . Marital Status: Married    Spouse Name: N/A  . Number of Children: N/A  . Years of Education: N/A   Occupational History  . Not on file.   Social History Main Topics  . Smoking status: Never Smoker   . Smokeless tobacco: Never Used  . Alcohol Use: Yes     Comment: 2 NIGHTS A WEEK ... WINE  . Drug Use: No  . Sexual Activity: Yes    Birth Control/ Protection: IUD     Comment: Mirena inserted 10-13-11, lives with husband, 2 kids, no dietary restrictions   Other  Topics Concern  . Not on file   Social History Narrative    No current outpatient prescriptions on file prior to visit.   Current Facility-Administered Medications on File Prior to Visit  Medication Dose Route Frequency Provider Last Rate Last Dose  . levonorgestrel (MIRENA) 20 MCG/24HR IUD   Intrauterine Once Anastasio Auerbach, MD        Allergies  Allergen Reactions  . Clindamycin/Lincomycin Hives  . Sulfa Antibiotics Hives    Review of Systems  Review of Systems  Constitutional: Negative for fever, chills and malaise/fatigue.  HENT: Negative for congestion, hearing loss and nosebleeds.   Eyes: Negative for discharge.  Respiratory: Negative for cough, sputum production, shortness of breath and wheezing.   Cardiovascular: Negative for chest pain, palpitations and leg swelling.  Gastrointestinal: Negative for heartburn, nausea, vomiting, abdominal pain, diarrhea, constipation and blood in stool.  Genitourinary: Negative for dysuria, urgency, frequency and hematuria.  Musculoskeletal: Negative for myalgias, back pain and falls.  Skin: Negative for rash.  Neurological: Negative for dizziness, tremors, sensory change, focal weakness, loss of consciousness, weakness and headaches.  Endo/Heme/Allergies: Negative for polydipsia. Does not bruise/bleed easily.  Psychiatric/Behavioral: Negative for depression and suicidal ideas. The patient is not nervous/anxious and does not have insomnia.     Objective  BP 105/70  mmHg  Pulse 50  Temp(Src) 98 F (36.7 C) (Oral)  Resp 16  Ht 5\' 4"  (1.626 m)  Wt 133 lb 2 oz (60.385 kg)  BMI 22.84 kg/m2  SpO2 97%  Physical Exam  Physical Exam  Constitutional: She is oriented to person, place, and time and well-developed, well-nourished, and in no distress. No distress.  HENT:  Head: Normocephalic and atraumatic.  Right Ear: External ear normal.  Left Ear: External ear normal.  Nose: Nose normal.  Mouth/Throat: Oropharynx is clear and  moist. No oropharyngeal exudate.  Eyes: Conjunctivae are normal. Pupils are equal, round, and reactive to light. Right eye exhibits no discharge. Left eye exhibits no discharge. No scleral icterus.  Neck: Normal range of motion. Neck supple. No thyromegaly present.  Cardiovascular: Normal rate, regular rhythm, normal heart sounds and intact distal pulses.   No murmur heard. Pulmonary/Chest: Effort normal and breath sounds normal. No respiratory distress. She has no wheezes. She has no rales.  Abdominal: Soft. Bowel sounds are normal. She exhibits no distension and no mass. There is no tenderness.  Musculoskeletal: Normal range of motion. She exhibits no edema or tenderness.  Lymphadenopathy:    She has no cervical adenopathy.  Neurological: She is alert and oriented to person, place, and time. She has normal reflexes. No cranial nerve deficit. Coordination normal.  Skin: Skin is warm and dry. No rash noted. She is not diaphoretic.  Psychiatric: Mood, memory and affect normal.    Lab Results  Component Value Date   TSH 2.964 01/17/2013   Lab Results  Component Value Date   WBC 4.7 01/18/2014   HGB 13.1 01/18/2014   HCT 38.6 01/18/2014   MCV 92.3 01/18/2014   PLT 321 01/18/2014   Lab Results  Component Value Date   CREATININE 0.75 01/18/2014   BUN 22 01/18/2014   NA 140 01/18/2014   K 4.1 01/18/2014   CL 99 01/18/2014   CO2 30 01/18/2014   Lab Results  Component Value Date   ALT 11 01/18/2014   AST 14 01/18/2014   ALKPHOS 36* 01/18/2014   BILITOT 0.7 01/18/2014   Lab Results  Component Value Date   CHOL 181 01/18/2014   Lab Results  Component Value Date   HDL 66 01/18/2014   Lab Results  Component Value Date   LDLCALC 95 01/18/2014   Lab Results  Component Value Date   TRIG 101 01/18/2014   Lab Results  Component Value Date   CHOLHDL 2.7 01/18/2014     Assessment & Plan  H/O atopic dermatitis With history of recurrent rash after yard work. Encouraged  to avoid contact with poison ivy when possible, cleanse with Dawn Dishwalking liquid and given a course of steroids to use as needed. Use antihistamine prn, seek care if no response   External hemorrhoids Asymptomatic at present. Encouraged to cleanse with witch hazel astringent prn. Add probiotics and fiber supplements. Increase clear fluids to 64 oz daily   Preventative health care Patient encouraged to maintain heart healthy diet, regular exercise, adequate sleep. Consider daily probiotics. Take medications as prescribed. Sees Dr Phineas Real of GYN has appt for pap next month. Reviewed annual labs from 2015 which were normal. So will wait to repeat unless her clinical picture changes

## 2015-03-17 NOTE — Assessment & Plan Note (Signed)
With history of recurrent rash after yard work. Encouraged to avoid contact with poison ivy when possible, cleanse with Dawn Dishwalking liquid and given a course of steroids to use as needed. Use antihistamine prn, seek care if no response

## 2015-03-17 NOTE — Assessment & Plan Note (Signed)
Asymptomatic at present. Encouraged to cleanse with witch hazel astringent prn. Add probiotics and fiber supplements. Increase clear fluids to 64 oz daily

## 2015-03-17 NOTE — Assessment & Plan Note (Signed)
Patient encouraged to maintain heart healthy diet, regular exercise, adequate sleep. Consider daily probiotics. Take medications as prescribed. Sees Dr Phineas Real of GYN has appt for pap next month. Reviewed annual labs from 2015 which were normal. So will wait to repeat unless her clinical picture changes

## 2015-03-18 ENCOUNTER — Ambulatory Visit (INDEPENDENT_AMBULATORY_CARE_PROVIDER_SITE_OTHER): Payer: 59 | Admitting: Gynecology

## 2015-03-18 ENCOUNTER — Encounter: Payer: Self-pay | Admitting: Gynecology

## 2015-03-18 VITALS — BP 110/64 | Ht 64.0 in | Wt 134.0 lb

## 2015-03-18 DIAGNOSIS — Z01419 Encounter for gynecological examination (general) (routine) without abnormal findings: Secondary | ICD-10-CM

## 2015-03-18 DIAGNOSIS — E559 Vitamin D deficiency, unspecified: Secondary | ICD-10-CM | POA: Diagnosis not present

## 2015-03-18 LAB — COMPREHENSIVE METABOLIC PANEL
ALK PHOS: 38 U/L — AB (ref 39–117)
ALT: 11 U/L (ref 0–35)
AST: 15 U/L (ref 0–37)
Albumin: 4.3 g/dL (ref 3.5–5.2)
BILIRUBIN TOTAL: 0.4 mg/dL (ref 0.2–1.2)
BUN: 19 mg/dL (ref 6–23)
CO2: 29 mEq/L (ref 19–32)
Calcium: 9.3 mg/dL (ref 8.4–10.5)
Chloride: 102 mEq/L (ref 96–112)
Creat: 0.78 mg/dL (ref 0.50–1.10)
Glucose, Bld: 77 mg/dL (ref 70–99)
Potassium: 4.2 mEq/L (ref 3.5–5.3)
Sodium: 138 mEq/L (ref 135–145)
TOTAL PROTEIN: 6.6 g/dL (ref 6.0–8.3)

## 2015-03-18 LAB — CBC WITH DIFFERENTIAL/PLATELET
Basophils Absolute: 0 10*3/uL (ref 0.0–0.1)
Basophils Relative: 1 % (ref 0–1)
EOS PCT: 2 % (ref 0–5)
Eosinophils Absolute: 0.1 10*3/uL (ref 0.0–0.7)
HEMATOCRIT: 38.7 % (ref 36.0–46.0)
HEMOGLOBIN: 12.9 g/dL (ref 12.0–15.0)
Lymphocytes Relative: 27 % (ref 12–46)
Lymphs Abs: 1.2 10*3/uL (ref 0.7–4.0)
MCH: 31.8 pg (ref 26.0–34.0)
MCHC: 33.3 g/dL (ref 30.0–36.0)
MCV: 95.3 fL (ref 78.0–100.0)
MONO ABS: 0.5 10*3/uL (ref 0.1–1.0)
MPV: 9.9 fL (ref 8.6–12.4)
Monocytes Relative: 11 % (ref 3–12)
Neutro Abs: 2.7 10*3/uL (ref 1.7–7.7)
Neutrophils Relative %: 59 % (ref 43–77)
Platelets: 292 10*3/uL (ref 150–400)
RBC: 4.06 MIL/uL (ref 3.87–5.11)
RDW: 13.5 % (ref 11.5–15.5)
WBC: 4.5 10*3/uL (ref 4.0–10.5)

## 2015-03-18 NOTE — Patient Instructions (Signed)
You may obtain a copy of any labs that were done today by logging onto MyChart as outlined in the instructions provided with your AVS (after visit summary). The office will not call with normal lab results but certainly if there are any significant abnormalities then we will contact you.   Health Maintenance, Female A healthy lifestyle and preventative care can promote health and wellness.  Maintain regular health, dental, and eye exams.  Eat a healthy diet. Foods like vegetables, fruits, whole grains, low-fat dairy products, and lean protein foods contain the nutrients you need without too many calories. Decrease your intake of foods high in solid fats, added sugars, and salt. Get information about a proper diet from your caregiver, if necessary.  Regular physical exercise is one of the most important things you can do for your health. Most adults should get at least 150 minutes of moderate-intensity exercise (any activity that increases your heart rate and causes you to sweat) each week. In addition, most adults need muscle-strengthening exercises on 2 or more days a week.   Maintain a healthy weight. The body mass index (BMI) is a screening tool to identify possible weight problems. It provides an estimate of body fat based on height and weight. Your caregiver can help determine your BMI, and can help you achieve or maintain a healthy weight. For adults 20 years and older:  A BMI below 18.5 is considered underweight.  A BMI of 18.5 to 24.9 is normal.  A BMI of 25 to 29.9 is considered overweight.  A BMI of 30 and above is considered obese.  Maintain normal blood lipids and cholesterol by exercising and minimizing your intake of saturated fat. Eat a balanced diet with plenty of fruits and vegetables. Blood tests for lipids and cholesterol should begin at age 61 and be repeated every 5 years. If your lipid or cholesterol levels are high, you are over 50, or you are a high risk for heart  disease, you may need your cholesterol levels checked more frequently.Ongoing high lipid and cholesterol levels should be treated with medicines if diet and exercise are not effective.  If you smoke, find out from your caregiver how to quit. If you do not use tobacco, do not start.  Lung cancer screening is recommended for adults aged 33 80 years who are at high risk for developing lung cancer because of a history of smoking. Yearly low-dose computed tomography (CT) is recommended for people who have at least a 30-pack-year history of smoking and are a current smoker or have quit within the past 15 years. A pack year of smoking is smoking an average of 1 pack of cigarettes a day for 1 year (for example: 1 pack a day for 30 years or 2 packs a day for 15 years). Yearly screening should continue until the smoker has stopped smoking for at least 15 years. Yearly screening should also be stopped for people who develop a health problem that would prevent them from having lung cancer treatment.  If you are pregnant, do not drink alcohol. If you are breastfeeding, be very cautious about drinking alcohol. If you are not pregnant and choose to drink alcohol, do not exceed 1 drink per day. One drink is considered to be 12 ounces (355 mL) of beer, 5 ounces (148 mL) of wine, or 1.5 ounces (44 mL) of liquor.  Avoid use of street drugs. Do not share needles with anyone. Ask for help if you need support or instructions about stopping  the use of drugs.  High blood pressure causes heart disease and increases the risk of stroke. Blood pressure should be checked at least every 1 to 2 years. Ongoing high blood pressure should be treated with medicines, if weight loss and exercise are not effective.  If you are 59 to 46 years old, ask your caregiver if you should take aspirin to prevent strokes.  Diabetes screening involves taking a blood sample to check your fasting blood sugar level. This should be done once every 3  years, after age 91, if you are within normal weight and without risk factors for diabetes. Testing should be considered at a younger age or be carried out more frequently if you are overweight and have at least 1 risk factor for diabetes.  Breast cancer screening is essential preventative care for women. You should practice "breast self-awareness." This means understanding the normal appearance and feel of your breasts and may include breast self-examination. Any changes detected, no matter how small, should be reported to a caregiver. Women in their 66s and 30s should have a clinical breast exam (CBE) by a caregiver as part of a regular health exam every 1 to 3 years. After age 101, women should have a CBE every year. Starting at age 100, women should consider having a mammogram (breast X-ray) every year. Women who have a family history of breast cancer should talk to their caregiver about genetic screening. Women at a high risk of breast cancer should talk to their caregiver about having an MRI and a mammogram every year.  Breast cancer gene (BRCA)-related cancer risk assessment is recommended for women who have family members with BRCA-related cancers. BRCA-related cancers include breast, ovarian, tubal, and peritoneal cancers. Having family members with these cancers may be associated with an increased risk for harmful changes (mutations) in the breast cancer genes BRCA1 and BRCA2. Results of the assessment will determine the need for genetic counseling and BRCA1 and BRCA2 testing.  The Pap test is a screening test for cervical cancer. Women should have a Pap test starting at age 57. Between ages 25 and 35, Pap tests should be repeated every 2 years. Beginning at age 37, you should have a Pap test every 3 years as long as the past 3 Pap tests have been normal. If you had a hysterectomy for a problem that was not cancer or a condition that could lead to cancer, then you no longer need Pap tests. If you are  between ages 50 and 76, and you have had normal Pap tests going back 10 years, you no longer need Pap tests. If you have had past treatment for cervical cancer or a condition that could lead to cancer, you need Pap tests and screening for cancer for at least 20 years after your treatment. If Pap tests have been discontinued, risk factors (such as a new sexual partner) need to be reassessed to determine if screening should be resumed. Some women have medical problems that increase the chance of getting cervical cancer. In these cases, your caregiver may recommend more frequent screening and Pap tests.  The human papillomavirus (HPV) test is an additional test that may be used for cervical cancer screening. The HPV test looks for the virus that can cause the cell changes on the cervix. The cells collected during the Pap test can be tested for HPV. The HPV test could be used to screen women aged 44 years and older, and should be used in women of any age  who have unclear Pap test results. After the age of 55, women should have HPV testing at the same frequency as a Pap test.  Colorectal cancer can be detected and often prevented. Most routine colorectal cancer screening begins at the age of 44 and continues through age 20. However, your caregiver may recommend screening at an earlier age if you have risk factors for colon cancer. On a yearly basis, your caregiver may provide home test kits to check for hidden blood in the stool. Use of a small camera at the end of a tube, to directly examine the colon (sigmoidoscopy or colonoscopy), can detect the earliest forms of colorectal cancer. Talk to your caregiver about this at age 86, when routine screening begins. Direct examination of the colon should be repeated every 5 to 10 years through age 13, unless early forms of pre-cancerous polyps or small growths are found.  Hepatitis C blood testing is recommended for all people born from [birth year] through 1965 and any  individual with known risks for hepatitis C.  Practice safe sex. Use condoms and avoid high-risk sexual practices to reduce the spread of sexually transmitted infections (STIs). Sexually active women aged 36 and younger should be checked for Chlamydia, which is a common sexually transmitted infection. Older women with new or multiple partners should also be tested for Chlamydia. Testing for other STIs is recommended if you are sexually active and at increased risk.  Osteoporosis is a disease in which the bones lose minerals and strength with aging. This can result in serious bone fractures. The risk of osteoporosis can be identified using a bone density scan. Women ages 20 and over and women at risk for fractures or osteoporosis should discuss screening with their caregivers. Ask your caregiver whether you should be taking a calcium supplement or vitamin D to reduce the rate of osteoporosis.  Menopause can be associated with physical symptoms and risks. Hormone replacement therapy is available to decrease symptoms and risks. You should talk to your caregiver about whether hormone replacement therapy is right for you.  Use sunscreen. Apply sunscreen liberally and repeatedly throughout the day. You should seek shade when your shadow is shorter than you. Protect yourself by wearing long sleeves, pants, a wide-brimmed hat, and sunglasses year round, whenever you are outdoors.  Notify your caregiver of new moles or changes in moles, especially if there is a change in shape or color. Also notify your caregiver if a mole is larger than the size of a pencil eraser.  Stay current with your immunizations. Document Released: 06/15/2011 Document Revised: 03/27/2013 Document Reviewed: 06/15/2011 [facility] Patient Information 2014 [place].

## 2015-03-18 NOTE — Progress Notes (Signed)
Valerie Jones 1971/03/01 [id number]        43 y.o.  [id number] for annual exam.  Doing well without complaints.  Past medical history,surgical history, problem list, medications, allergies, family history and social history were all reviewed and documented as reviewed in the EPIC chart.  ROS:  Performed with pertinent positives and negatives included in the history, assessment and plan.   Additional significant findings :  none   Exam: Valerie Jones Vitals:   03/18/15 0902  BP: 110/64  Height: 5\' 4"  (1.626 m)  Weight: 134 lb (60.782 kg)   General appearance:  Normal affect, orientation and appearance. Skin: Grossly normal HEENT: Without gross lesions.  No cervical or supraclavicular adenopathy. Thyroid normal.  Lungs:  Clear without wheezing, rales or rhonchi Cardiac: RR, without RMG Abdominal:  Soft, nontender, without masses, guarding, rebound, organomegaly or hernia Breasts:  Examined lying and sitting without masses, retractions, discharge or axillary adenopathy. Pelvic:  Ext/BUS/vagina normal  Cervix normal with IUD string visualized  Uterus anteverted, normal size, shape and contour, midline and mobile nontender   Adnexa  Without masses or tenderness    Anus and perineum  Normal   Rectovaginal  Normal sphincter tone without palpated masses or tenderness.    Assessment/Plan:  44 y.o. [id number] female for annual exam without menses, Mirena IUD.   1. Mirena IUD 09/2011. Doing well without menses. IUD string visualized. 2. Mammography 09/2014. Continue with annual mammography. SBE monthly reviewed. 3. Pap smears/HPV 01/2013 negative. No Pap smear done today. No history of significant abnormal Pap smears. Repeat Pap smear at 3-5 year interval per current screening guidelines. 4. Vitamin D deficiency.  Vitamin D 25 2014. Was to take extra vitamin D but is not doing this. Check vitamin D level today. 5. Health maintenance.  Lipid profiles normal the last 2 years. Will skip  this year. Check baseline CBC, comprehensive metabolic panel, urinalysis. Follow up 1 year, sooner as needed.    Anastasio Auerbach MD, 9:57 AM 03/18/2015

## 2015-03-19 LAB — URINALYSIS W MICROSCOPIC + REFLEX CULTURE
BILIRUBIN URINE: NEGATIVE
Bacteria, UA: NONE SEEN
CRYSTALS: NONE SEEN
Casts: NONE SEEN
GLUCOSE, UA: NEGATIVE mg/dL
Hgb urine dipstick: NEGATIVE
KETONES UR: NEGATIVE mg/dL
Leukocytes, UA: NEGATIVE
Nitrite: NEGATIVE
Protein, ur: NEGATIVE mg/dL
Squamous Epithelial / LPF: NONE SEEN
Urobilinogen, UA: 0.2 mg/dL (ref 0.0–1.0)
pH: 7 (ref 5.0–8.0)

## 2015-03-19 LAB — VITAMIN D 25 HYDROXY (VIT D DEFICIENCY, FRACTURES): Vit D, 25-Hydroxy: 27 ng/mL — ABNORMAL LOW (ref 30–100)

## 2015-03-21 ENCOUNTER — Other Ambulatory Visit: Payer: Self-pay | Admitting: Gynecology

## 2015-03-21 DIAGNOSIS — E559 Vitamin D deficiency, unspecified: Secondary | ICD-10-CM

## 2015-05-01 ENCOUNTER — Ambulatory Visit (INDEPENDENT_AMBULATORY_CARE_PROVIDER_SITE_OTHER): Payer: 59 | Admitting: Physician Assistant

## 2015-05-01 ENCOUNTER — Encounter: Payer: Self-pay | Admitting: Physician Assistant

## 2015-05-01 VITALS — BP 99/46 | HR 51 | Temp 98.5°F | Ht 64.0 in | Wt 127.8 lb

## 2015-05-01 DIAGNOSIS — J3489 Other specified disorders of nose and nasal sinuses: Secondary | ICD-10-CM

## 2015-05-01 MED ORDER — MUPIROCIN CALCIUM 2 % NA OINT: 1.0000 "application " | TOPICAL_OINTMENT | Freq: Two times a day (BID) | NASAL | Status: DC

## 2015-05-01 NOTE — Progress Notes (Signed)
Pre visit review using our clinic review tool, if applicable. No additional management support is needed unless otherwise documented below in the visit note. 

## 2015-05-01 NOTE — Progress Notes (Signed)
Patient presents to clinic today c/o painful sore in R nasal vestibule first noticed 4 days ago that is progressively enlarging.  Denies trauma or injury. Denies fever, chills, malaise.  Denies lesion elsewhere. Denies personal hx of MRSA skin infection previously.  Past Medical History  Diagnosis Date  . History of chicken pox   . GERD (gastroesophageal reflux disease)   . Hemorrhoids   . H/O atopic dermatitis 03/17/2015  . Preventative health care 03/17/2015    Current Outpatient Prescriptions on File Prior to Visit  Medication Sig Dispense Refill  . hydrocortisone (ANUSOL-HC) 2.5 % rectal cream Place 1 application rectally 2 (two) times daily. 30 g 0   Current Facility-Administered Medications on File Prior to Visit  Medication Dose Route Frequency Provider Last Rate Last Dose  . levonorgestrel (MIRENA) 20 MCG/24HR IUD   Intrauterine Once Anastasio Auerbach, MD        Allergies  Allergen Reactions  . Clindamycin/Lincomycin Hives  . Sulfa Antibiotics Hives    Family History  Problem Relation Age of Onset  . Hypertension Father   . Cancer Maternal Grandfather     Non Hodgkins Lymphoma  . Hypertension Sister   . Mental illness Mother     anxiety  . Irritable bowel syndrome Mother   . COPD Maternal Grandmother     smoker    History   Social History  . Marital Status: Married    Spouse Name: N/A  . Number of Children: N/A  . Years of Education: N/A   Social History Main Topics  . Smoking status: Never Smoker   . Smokeless tobacco: Never Used  . Alcohol Use: 0.0 oz/week    0 Standard drinks or equivalent per week     Comment: 2 NIGHTS A WEEK ... WINE  . Drug Use: No  . Sexual Activity: Yes    Birth Control/ Protection: IUD     Comment: Mirena inserted 10-13-11,,Pt declined sexual Hx    Other Topics Concern  . None   Social History Narrative   Review of Systems - See HPI.  All other ROS are negative.  BP 99/46 mmHg  Pulse 51  Temp(Src) 98.5 F (36.9 C)  (Oral)  Ht 5' 4" (1.626 m)  Wt 127 lb 12.8 oz (57.97 kg)  BMI 21.93 kg/m2  SpO2 98%  Physical Exam  Constitutional: She is oriented to person, place, and time and well-developed, well-nourished, and in no distress.  HENT:  Head: Normocephalic and atraumatic.  Mouth/Throat: Oropharynx is clear and moist.  2-3 mm lesion within the right nasal vestibule with some purulent drainage noted.  Cardiovascular: Normal rate, regular rhythm, normal heart sounds and intact distal pulses.   Pulmonary/Chest: Effort normal and breath sounds normal. No respiratory distress. She has no wheezes. She has no rales. She exhibits no tenderness.  Neurological: She is alert and oriented to person, place, and time.  Skin: Skin is warm and dry.  Psychiatric: Affect normal.  Vitals reviewed.   Recent Results (from the past 2160 hour(s))  CBC with Differential/Platelet     Status: None   Collection Time: 03/18/15  9:46 AM  Result Value Ref Range   WBC 4.5 4.0 - 10.5 K/uL   RBC 4.06 3.87 - 5.11 MIL/uL   Hemoglobin 12.9 12.0 - 15.0 g/dL   HCT 38.7 36.0 - 46.0 %   MCV 95.3 78.0 - 100.0 fL   MCH 31.8 26.0 - 34.0 pg   MCHC 33.3 30.0 - 36.0 g/dL  RDW 13.5 11.5 - 15.5 %   Platelets 292 150 - 400 K/uL   MPV 9.9 8.6 - 12.4 fL   Neutrophils Relative % 59 43 - 77 %   Neutro Abs 2.7 1.7 - 7.7 K/uL   Lymphocytes Relative 27 12 - 46 %   Lymphs Abs 1.2 0.7 - 4.0 K/uL   Monocytes Relative 11 3 - 12 %   Monocytes Absolute 0.5 0.1 - 1.0 K/uL   Eosinophils Relative 2 0 - 5 %   Eosinophils Absolute 0.1 0.0 - 0.7 K/uL   Basophils Relative 1 0 - 1 %   Basophils Absolute 0.0 0.0 - 0.1 K/uL   Smear Review Criteria for review not met   Comprehensive metabolic panel     Status: Abnormal   Collection Time: 03/18/15  9:46 AM  Result Value Ref Range   Sodium 138 135 - 145 mEq/L   Potassium 4.2 3.5 - 5.3 mEq/L   Chloride 102 96 - 112 mEq/L   CO2 29 19 - 32 mEq/L   Glucose, Bld 77 70 - 99 mg/dL   BUN 19 6 - 23 mg/dL    Creat 0.78 0.50 - 1.10 mg/dL   Total Bilirubin 0.4 0.2 - 1.2 mg/dL   Alkaline Phosphatase 38 (L) 39 - 117 U/L   AST 15 0 - 37 U/L   ALT 11 0 - 35 U/L   Total Protein 6.6 6.0 - 8.3 g/dL   Albumin 4.3 3.5 - 5.2 g/dL   Calcium 9.3 8.4 - 10.5 mg/dL  Urinalysis w microscopic + reflex cultur     Status: Abnormal   Collection Time: 03/18/15  9:46 AM  Result Value Ref Range   Color, Urine YELLOW YELLOW   APPearance CLEAR CLEAR   Specific Gravity, Urine <1.005 (L) 1.005 - 1.030   pH 7.0 5.0 - 8.0   Glucose, UA NEG NEG mg/dL   Bilirubin Urine NEG NEG   Ketones, ur NEG NEG mg/dL   Hgb urine dipstick NEG NEG   Protein, ur NEG NEG mg/dL   Urobilinogen, UA 0.2 0.0 - 1.0 mg/dL   Nitrite NEG NEG   Leukocytes, UA NEG NEG   Squamous Epithelial / LPF NONE SEEN RARE   Crystals NONE SEEN NONE SEEN   Casts NONE SEEN NONE SEEN   WBC, UA 0-2 <3 WBC/hpf   RBC / HPF 0-2 <3 RBC/hpf   Bacteria, UA NONE SEEN RARE  Vit D  25 hydroxy (rtn osteoporosis monitoring)     Status: Abnormal   Collection Time: 03/18/15  9:46 AM  Result Value Ref Range   Vit D, 25-Hydroxy 27 (L) 30 - 100 ng/mL    Comment: Vitamin D Status           25-OH Vitamin D        Deficiency                <20 ng/mL        Insufficiency         20 - 29 ng/mL        Optimal             > or = 30 ng/mL   For 25-OH Vitamin D testing on patients on D2-supplementation and patients for whom quantitation of D2 and D3 fractions is required, the [facility] 25-OH VIT D, (D2,D3), LC/MS/MS is recommended: order code 85814 (patients > 2 yrs).     Assessment/Plan: Nasal vestibulitis Rx Bactroban nasal ointment. Supportive measures discussed.    If not improving, will switch to oral antibiotic course.       

## 2015-05-01 NOTE — Patient Instructions (Signed)
Apply the antibiotic as directed for 5 days. Stay well hydrated.  Can also use some vaseline in the nose too to coat it and protect it from irritation.  If not improving, we will need to start oral antibiotic course.

## 2015-05-01 NOTE — Assessment & Plan Note (Signed)
Rx Bactroban nasal ointment. Supportive measures discussed.  If not improving, will switch to oral antibiotic course.

## 2015-11-14 ENCOUNTER — Other Ambulatory Visit: Payer: Self-pay

## 2015-11-14 DIAGNOSIS — Z1231 Encounter for screening mammogram for malignant neoplasm of breast: Secondary | ICD-10-CM

## 2015-12-25 ENCOUNTER — Ambulatory Visit
Admission: RE | Admit: 2015-12-25 | Discharge: 2015-12-25 | Disposition: A | Discharge status: Home / Self Care | Payer: 59 | Source: Ambulatory Visit

## 2015-12-25 DIAGNOSIS — Z1231 Encounter for screening mammogram for malignant neoplasm of breast: Secondary | ICD-10-CM

## 2016-03-09 ENCOUNTER — Ambulatory Visit: Payer: 59 | Admitting: Sports Medicine

## 2016-03-09 DIAGNOSIS — M2011 Hallux valgus (acquired), right foot: Secondary | ICD-10-CM | POA: Diagnosis not present

## 2016-03-16 ENCOUNTER — Ambulatory Visit (INDEPENDENT_AMBULATORY_CARE_PROVIDER_SITE_OTHER): Payer: 59 | Admitting: Sports Medicine

## 2016-03-16 VITALS — BP 101/38 | Ht 64.0 in | Wt 130.0 lb

## 2016-03-16 DIAGNOSIS — M7632 Iliotibial band syndrome, left leg: Secondary | ICD-10-CM | POA: Diagnosis not present

## 2016-03-16 NOTE — Progress Notes (Signed)
   Subjective:    Patient ID: Valerie Jones, female    DOB: 07/16/1971, 45 y.o.   MRN: [id number]  HPI chief complaint: Left knee pain  Very pleasant 45 year old runner comes in today complaining of intermittent left knee pain which has been present now for a couple of years. Pain has become more frequent over the past 2 months. She describes an aching discomfort along the lateral knee which will occur about a mile into her runs. She runs 9-15 miles a week and has done so for quite some time. She denies any increase in activity. She also cross trains 5 times a week. Her only pain is with running. It resolves quickly when she stops. No pain with walking. No swelling. No feelings of instability. No mechanical symptoms. No prior knee surgeries. No significant prior knee injuries. No radiating pain. No pain at rest. She has not needed to take any sort of pain medication.  Past medical history reviewed Medications reviewed Allergies reviewed    Review of Systems    as above Objective:   Physical Exam  Well-developed, fit appearing. No acute distress. Awake alert and oriented 3. Vital signs reviewed  Left knee: Full range of motion. There is a small palpable retropatellar click with extension. No pain with patellar compression. No crepitus. Knee is stable to valgus and varus stressing. Negative anterior drawer, negative posterior drawer. No tenderness to palpation along the medial or lateral joint lines. Negative McMurray's. Negative Thessaly's. No tenderness to palpation along the distal IT band but there is significant hip abductor weakness with resistance. Neurovascularly intact distally.      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 She may also try some IT band stretching. She may resume running as tolerated. Follow-up with me in 6 weeks for reevaluation if still symptomatic. Call with  questions or concerns in the interim.

## 2016-03-20 ENCOUNTER — Ambulatory Visit (INDEPENDENT_AMBULATORY_CARE_PROVIDER_SITE_OTHER): Payer: 59 | Admitting: Gynecology

## 2016-03-20 ENCOUNTER — Encounter: Payer: Self-pay | Admitting: Gynecology

## 2016-03-20 VITALS — BP 116/74 | Ht 64.0 in | Wt 133.0 lb

## 2016-03-20 DIAGNOSIS — Z13220 Encounter for screening for lipoid disorders: Secondary | ICD-10-CM

## 2016-03-20 DIAGNOSIS — Z01419 Encounter for gynecological examination (general) (routine) without abnormal findings: Secondary | ICD-10-CM

## 2016-03-20 DIAGNOSIS — Z30431 Encounter for routine checking of intrauterine contraceptive device: Secondary | ICD-10-CM

## 2016-03-20 LAB — LIPID PANEL
Cholesterol: 161 mg/dL (ref 125–200)
HDL: 75 mg/dL
LDL Cholesterol: 71 mg/dL
Total CHOL/HDL Ratio: 2.1 ratio
Triglycerides: 75 mg/dL
VLDL: 15 mg/dL

## 2016-03-20 LAB — COMPREHENSIVE METABOLIC PANEL
ALBUMIN: 4.4 g/dL (ref 3.6–5.1)
ALK PHOS: 34 U/L (ref 33–115)
ALT: 11 U/L (ref 6–29)
AST: 15 U/L (ref 10–35)
BUN: 15 mg/dL (ref 7–25)
CALCIUM: 9.2 mg/dL (ref 8.6–10.2)
CHLORIDE: 105 mmol/L (ref 98–110)
CO2: 29 mmol/L (ref 20–31)
Creat: 0.76 mg/dL (ref 0.50–1.10)
Glucose, Bld: 58 mg/dL — ABNORMAL LOW (ref 65–99)
Potassium: 4.3 mmol/L (ref 3.5–5.3)
Sodium: 139 mmol/L (ref 135–146)
Total Bilirubin: 0.4 mg/dL (ref 0.2–1.2)
Total Protein: 6.5 g/dL (ref 6.1–8.1)

## 2016-03-20 LAB — CBC WITH DIFFERENTIAL/PLATELET
Basophils Absolute: 45 cells/uL (ref 0–200)
Basophils Relative: 1 %
EOS ABS: 135 {cells}/uL (ref 15–500)
Eosinophils Relative: 3 %
HEMATOCRIT: 40.5 % (ref 35.0–45.0)
HEMOGLOBIN: 13.4 g/dL (ref 11.7–15.5)
LYMPHS ABS: 1485 {cells}/uL (ref 850–3900)
Lymphocytes Relative: 33 %
MCH: 31.7 pg (ref 27.0–33.0)
MCHC: 33.1 g/dL (ref 32.0–36.0)
MCV: 95.7 fL (ref 80.0–100.0)
MONO ABS: 540 {cells}/uL (ref 200–950)
MPV: 10.1 fL (ref 7.5–12.5)
Monocytes Relative: 12 %
NEUTROS ABS: 2295 {cells}/uL (ref 1500–7800)
Neutrophils Relative %: 51 %
Platelets: 292 10*3/uL (ref 140–400)
RBC: 4.23 MIL/uL (ref 3.80–5.10)
RDW: 13.4 % (ref 11.0–15.0)
WBC: 4.5 10*3/uL (ref 3.8–10.8)

## 2016-03-20 NOTE — Patient Instructions (Signed)
Follow up to have your IUD changed this coming fall November/December 2017  You may obtain a copy of any labs that were done today by logging onto MyChart as outlined in the instructions provided with your AVS (after visit summary). The office will not call with normal lab results but certainly if there are any significant abnormalities then we will contact you.   Health Maintenance Adopting a healthy lifestyle and getting preventive care can go a long way to promote health and wellness. Talk with your health care provider about what schedule of regular examinations is right for you. This is a good chance for you to check in with your provider about disease prevention and staying healthy. In between checkups, there are plenty of things you can do on your own. Experts have done a lot of research about which lifestyle changes and preventive measures are most likely to keep you healthy. Ask your health care provider for more information. WEIGHT AND DIET  Eat a healthy diet  Be sure to include plenty of vegetables, fruits, low-fat dairy products, and lean protein.  Do not eat a lot of foods high in solid fats, added sugars, or salt.  Get regular exercise. This is one of the most important things you can do for your health.  Most adults should exercise for at least 150 minutes each week. The exercise should increase your heart rate and make you sweat (moderate-intensity exercise).  Most adults should also do strengthening exercises at least twice a week. This is in addition to the moderate-intensity exercise.  Maintain a healthy weight  Body mass index (BMI) is a measurement that can be used to identify possible weight problems. It estimates body fat based on height and weight. Your health care provider can help determine your BMI and help you achieve or maintain a healthy weight.  For females 75 years of age and older:   A BMI below 18.5 is considered underweight.  A BMI of 18.5 to 24.9 is  normal.  A BMI of 25 to 29.9 is considered overweight.  A BMI of 30 and above is considered obese.  Watch levels of cholesterol and blood lipids  You should start having your blood tested for lipids and cholesterol at 47 years of age, then have this test every 5 years.  You may need to have your cholesterol levels checked more often if:  Your lipid or cholesterol levels are high.  You are older than 47 years of age.  You are at high risk for heart disease.  CANCER SCREENING   Lung Cancer  Lung cancer screening is recommended for adults 15-48 years old who are at high risk for lung cancer because of a history of smoking.  A yearly low-dose CT scan of the lungs is recommended for people who:  Currently smoke.  Have quit within the past 15 years.  Have at least a 30-pack-year history of smoking. A pack year is smoking an average of one pack of cigarettes a day for 1 year.  Yearly screening should continue until it has been 15 years since you quit.  Yearly screening should stop if you develop a health problem that would prevent you from having lung cancer treatment.  Breast Cancer  Practice breast self-awareness. This means understanding how your breasts normally appear and feel.  It also means doing regular breast self-exams. Let your health care provider know about any changes, no matter how small.  If you are in your 20s or 30s, you should  have a clinical breast exam (CBE) by a health care provider every 1-3 years as part of a regular health exam.  If you are 20 or older, have a CBE every year. Also consider having a breast X-ray (mammogram) every year.  If you have a family history of breast cancer, talk to your health care provider about genetic screening.  If you are at high risk for breast cancer, talk to your health care provider about having an MRI and a mammogram every year.  Breast cancer gene (BRCA) assessment is recommended for women who have family members  with BRCA-related cancers. BRCA-related cancers include:  Breast.  Ovarian.  Tubal.  Peritoneal cancers.  Results of the assessment will determine the need for genetic counseling and BRCA1 and BRCA2 testing. Cervical Cancer Routine pelvic examinations to screen for cervical cancer are no longer recommended for nonpregnant women who are considered low risk for cancer of the pelvic organs (ovaries, uterus, and vagina) and who do not have symptoms. A pelvic examination may be necessary if you have symptoms including those associated with pelvic infections. Ask your health care provider if a screening pelvic exam is right for you.   The Pap test is the screening test for cervical cancer for women who are considered at risk.  If you had a hysterectomy for a problem that was not cancer or a condition that could lead to cancer, then you no longer need Pap tests.  If you are older than 65 years, and you have had normal Pap tests for the past 10 years, you no longer need to have Pap tests.  If you have had past treatment for cervical cancer or a condition that could lead to cancer, you need Pap tests and screening for cancer for at least 20 years after your treatment.  If you no longer get a Pap test, assess your risk factors if they change (such as having a new sexual partner). This can affect whether you should start being screened again.  Some women have medical problems that increase their chance of getting cervical cancer. If this is the case for you, your health care provider may recommend more frequent screening and Pap tests.  The human papillomavirus (HPV) test is another test that may be used for cervical cancer screening. The HPV test looks for the virus that can cause cell changes in the cervix. The cells collected during the Pap test can be tested for HPV.  The HPV test can be used to screen women 11 years of age and older. Getting tested for HPV can extend the interval between normal  Pap tests from three to five years.  An HPV test also should be used to screen women of any age who have unclear Pap test results.  After 47 years of age, women should have HPV testing as often as Pap tests.  Colorectal Cancer  This type of cancer can be detected and often prevented.  Routine colorectal cancer screening usually begins at 47 years of age and continues through 47 years of age.  Your health care provider may recommend screening at an earlier age if you have risk factors for colon cancer.  Your health care provider may also recommend using home test kits to check for hidden blood in the stool.  A small camera at the end of a tube can be used to examine your colon directly (sigmoidoscopy or colonoscopy). This is done to check for the earliest forms of colorectal cancer.  Routine screening usually  begins at age 73.  Direct examination of the colon should be repeated every 5-10 years through 47 years of age. However, you may need to be screened more often if early forms of precancerous polyps or small growths are found. Skin Cancer  Check your skin from head to toe regularly.  Tell your health care provider about any new moles or changes in moles, especially if there is a change in a mole's shape or color.  Also tell your health care provider if you have a mole that is larger than the size of a pencil eraser.  Always use sunscreen. Apply sunscreen liberally and repeatedly throughout the day.  Protect yourself by wearing long sleeves, pants, a wide-brimmed hat, and sunglasses whenever you are outside. HEART DISEASE, DIABETES, AND HIGH BLOOD PRESSURE   Have your blood pressure checked at least every 1-2 years. High blood pressure causes heart disease and increases the risk of stroke.  If you are between 69 years and 71 years old, ask your health care provider if you should take aspirin to prevent strokes.  Have regular diabetes screenings. This involves taking a blood  sample to check your fasting blood sugar level.  If you are at a normal weight and have a low risk for diabetes, have this test once every three years after 47 years of age.  If you are overweight and have a high risk for diabetes, consider being tested at a younger age or more often. PREVENTING INFECTION  Hepatitis B  If you have a higher risk for hepatitis B, you should be screened for this virus. You are considered at high risk for hepatitis B if:  You were born in a country where hepatitis B is common. Ask your health care provider which countries are considered high risk.  Your parents were born in a high-risk country, and you have not been immunized against hepatitis B (hepatitis B vaccine).  You have HIV or AIDS.  You use needles to inject street drugs.  You live with someone who has hepatitis B.  You have had sex with someone who has hepatitis B.  You get hemodialysis treatment.  You take certain medicines for conditions, including cancer, organ transplantation, and autoimmune conditions. Hepatitis C  Blood testing is recommended for:  Everyone born from [birth year] through 1967.  Anyone with known risk factors for hepatitis C. Sexually transmitted infections (STIs)  You should be screened for sexually transmitted infections (STIs) including gonorrhea and chlamydia if:  You are sexually active and are younger than 47 years of age.  You are older than 47 years of age and your health care provider tells you that you are at risk for this type of infection.  Your sexual activity has changed since you were last screened and you are at an increased risk for chlamydia or gonorrhea. Ask your health care provider if you are at risk.  If you do not have HIV, but are at risk, it may be recommended that you take a prescription medicine daily to prevent HIV infection. This is called pre-exposure prophylaxis (PrEP). You are considered at risk if:  You are sexually active and do not  regularly use condoms or know the HIV status of your partner(s).  You take drugs by injection.  You are sexually active with a partner who has HIV. Talk with your health care provider about whether you are at high risk of being infected with HIV. If you choose to begin PrEP, you should first be tested for  HIV. You should then be tested every 3 months for as long as you are taking PrEP.  PREGNANCY   If you are premenopausal and you may become pregnant, ask your health care provider about preconception counseling.  If you may become pregnant, take 400 to 800 micrograms (mcg) of folic acid every day.  If you want to prevent pregnancy, talk to your health care provider about birth control (contraception). OSTEOPOROSIS AND MENOPAUSE   Osteoporosis is a disease in which the bones lose minerals and strength with aging. This can result in serious bone fractures. Your risk for osteoporosis can be identified using a bone density scan.  If you are 80 years of age or older, or if you are at risk for osteoporosis and fractures, ask your health care provider if you should be screened.  Ask your health care provider whether you should take a calcium or vitamin D supplement to lower your risk for osteoporosis.  Menopause may have certain physical symptoms and risks.  Hormone replacement therapy may reduce some of these symptoms and risks. Talk to your health care provider about whether hormone replacement therapy is right for you.  HOME CARE INSTRUCTIONS   Schedule regular health, dental, and eye exams.  Stay current with your immunizations.   Do not use any tobacco products including cigarettes, chewing tobacco, or electronic cigarettes.  If you are pregnant, do not drink alcohol.  If you are breastfeeding, limit how much and how often you drink alcohol.  Limit alcohol intake to no more than 1 drink per day for nonpregnant women. One drink equals 12 ounces of beer, 5 ounces of wine, or 1  ounces of hard liquor.  Do not use street drugs.  Do not share needles.  Ask your health care provider for help if you need support or information about quitting drugs.  Tell your health care provider if you often feel depressed.  Tell your health care provider if you have ever been abused or do not feel safe at home. Document Released: 06/15/2011 Document Revised: 04/16/2014 Document Reviewed: 11/01/2013 [facility] Patient Information 2015 [place], [place]. This information is not intended to replace advice given to you by your health care provider. Make sure you discuss any questions you have with your health care provider.

## 2016-03-20 NOTE — Progress Notes (Signed)
    KHALEYA AWADALLA Apr 30, 1971 [id number]        45 y.o.  G2P2002  for annual exam.  Doing well without complaints. Several issues noted below.  Past medical history,surgical history, problem list, medications, allergies, family history and social history were all reviewed and documented as reviewed in the EPIC chart.  ROS:  Performed with pertinent positives and negatives included in the history, assessment and plan.   Additional significant findings :  none   Exam: Caryn Bee assistant Filed Vitals:   03/20/16 0901  BP: 116/74  Height: 5\' 4"  (1.626 m)  Weight: 133 lb (60.328 kg)   General appearance:  Normal affect, orientation and appearance. Skin: Grossly normal HEENT: Without gross lesions.  No cervical or supraclavicular adenopathy. Thyroid normal.  Lungs:  Clear without wheezing, rales or rhonchi Cardiac: RR, without RMG Abdominal:  Soft, nontender, without masses, guarding, rebound, organomegaly or hernia Breasts:  Examined lying and sitting without masses, retractions, discharge or axillary adenopathy. Pelvic:  Ext/BUS/vagina normal  Cervix normal with IUD string visualized  Uterus anteverted, normal size, shape and contour, midline and mobile nontender   Adnexa without masses or tenderness    Anus and perineum normal   Rectovaginal normal sphincter tone without palpated masses or tenderness.    Assessment/Plan:  45 y.o. G53P2002 female for annual exam without menses, Mirena IUD.   1. Mirena IUD 09/2011. Doing well. Due to have replaced this coming fall and I reminded her to schedule this. 2. Mammography 12/2015. Continue with annual mammography when due. SBE monthly reviewed. 3. Pap smear/HPV 01/2013 negative. No Pap smear done today. No history of significant abnormal Pap smears previously. Plan repeat Pap smear by 5 years per current screening guidelines. 4. Health maintenance. Baseline CBC, CMP, lipid profile, urinalysis ordered. Follow up for IUD exchange this  coming fall otherwise annual exam 1 year, sooner as needed.   Anastasio Auerbach MD, 9:42 AM 03/20/2016

## 2016-03-21 LAB — URINALYSIS W MICROSCOPIC + REFLEX CULTURE
Bacteria, UA: NONE SEEN [HPF]
Bilirubin Urine: NEGATIVE
CRYSTALS: NONE SEEN [HPF]
Casts: NONE SEEN [LPF]
Glucose, UA: NEGATIVE
HGB URINE DIPSTICK: NEGATIVE
KETONES UR: NEGATIVE
Leukocytes, UA: NEGATIVE
Nitrite: NEGATIVE
Protein, ur: NEGATIVE
SPECIFIC GRAVITY, URINE: 1.025 (ref 1.001–1.035)
SQUAMOUS EPITHELIAL / LPF: NONE SEEN [HPF] (ref <=5)
WBC, UA: NONE SEEN WBC/HPF (ref <=5)
Yeast: NONE SEEN [HPF]
pH: 7 (ref 5.0–8.0)

## 2016-03-23 ENCOUNTER — Other Ambulatory Visit: Payer: Self-pay | Admitting: Gynecology

## 2016-03-23 LAB — URINE CULTURE: Colony Count: >=100000

## 2016-03-23 MED ORDER — CIPROFLOXACIN HCL 250 MG PO TABS: 250.0000 mg | ORAL_TABLET | Freq: Two times a day (BID) | ORAL | Status: DC

## 2016-04-27 ENCOUNTER — Ambulatory Visit: Payer: 59 | Admitting: Sports Medicine

## 2016-05-18 DIAGNOSIS — Z0100 Encounter for examination of eyes and vision without abnormal findings: Secondary | ICD-10-CM | POA: Diagnosis not present

## 2016-07-14 ENCOUNTER — Encounter: Payer: 59 | Admitting: Family Medicine

## 2016-10-26 ENCOUNTER — Telehealth: Payer: Self-pay | Admitting: *Deleted

## 2016-10-26 NOTE — Telephone Encounter (Signed)
Per Levada Dy Removal/insertion of Mirena covered 100%. Pt will be called and scheduled KW CMA

## 2016-11-04 ENCOUNTER — Telehealth: Payer: Self-pay | Admitting: *Deleted

## 2016-11-04 NOTE — Telephone Encounter (Signed)
Spoke to [place] at [facility] ref # [id number]  Removal./insertion of Mirena covered 100%  Pt will be called to schedule KW CMA

## 2016-11-13 ENCOUNTER — Encounter: Payer: Self-pay | Admitting: Gynecology

## 2016-11-13 ENCOUNTER — Ambulatory Visit (INDEPENDENT_AMBULATORY_CARE_PROVIDER_SITE_OTHER): Payer: 59 | Admitting: Gynecology

## 2016-11-13 VITALS — BP 116/72

## 2016-11-13 DIAGNOSIS — Z30433 Encounter for removal and reinsertion of intrauterine contraceptive device: Secondary | ICD-10-CM

## 2016-11-13 HISTORY — PX: INTRAUTERINE DEVICE INSERTION: SHX323

## 2016-11-13 NOTE — Progress Notes (Signed)
    Valerie Jones 1971/11/15 [id number]        45 y.o.  G2P2002  presents for Mirena IUD removal and replacement. She has read through the booklet, has no contraindications and signed the consent form. I reviewed the removal and insertional process with her as well as the risks to include infection, either immediate or long-term, uterine perforation or migration requiring surgery to remove, other complications such as pain and possibility of failure with subsequent pregnancy.   Exam with Gilman Schmidt assistant Vitals:   11/13/16 1533  BP: 116/72    Pelvic: External BUS vagina normal. Cervix normal IUD string visualized. Uterus anteverted normal size shape contour midline mobile nontender. Adnexa without masses or tenderness.  Procedure: The cervix was visualized and the old Mirena IUD string was grasped with the [facility] forcep and removed, shown to the patient and discarded. The cervix was then cleansed with Betadine, anterior lip grasped with a single-tooth tenaculum, the uterus was sounded and a new Mirena IUD was placed according to manufacturer's recommendations without difficulty. The strings were trimmed. The patient tolerated well and will follow up in one month for a postinsertional check.  Lot number:  [id number]    Anastasio Auerbach MD, 3:56 PM 11/13/2016

## 2016-11-13 NOTE — Patient Instructions (Signed)
Intrauterine Device Insertion Most often, an intrauterine device (IUD) is inserted into the uterus to prevent pregnancy. There are 2 types of IUDs available:  Copper IUD-This type of IUD creates an environment that is not favorable to sperm survival. The mechanism of action of the copper IUD is not known for certain. It can stay in place for 10 years.  Hormone IUD-This type of IUD contains the hormone progestin (synthetic progesterone). The progestin thickens the cervical mucus and prevents sperm from entering the uterus, and it also thins the uterine lining. There is no evidence that the hormone IUD prevents implantation. One hormone IUD can stay in place for up to 5 years, and a different hormone IUD can stay in place for up to 3 years. An IUD is the most cost-effective birth control if left in place for the full duration. It may be removed at any time. LET YOUR HEALTH CARE PROVIDER KNOW ABOUT:  Any allergies you have.  All medicines you are taking, including vitamins, herbs, eye drops, creams, and over-the-counter medicines.  Previous problems you or members of your family have had with the use of anesthetics.  Any blood disorders you have.  Previous surgeries you have had.  Possibility of pregnancy.  Medical conditions you have. RISKS AND COMPLICATIONS  Generally, intrauterine device insertion is a safe procedure. However, as with any procedure, complications can occur. Possible complications include:  Accidental puncture (perforation) of the uterus.  Accidental placement of the IUD either in the muscle layer of the uterus (myometrium) or outside the uterus. If this happens, the IUD can be found essentially floating around the bowels and must be taken out surgically.  The IUD may fall out of the uterus (expulsion). This is more common in women who have recently had a child.   Pregnancy in the fallopian tube (ectopic).  Pelvic inflammatory disease (PID), which is infection of  the uterus and fallopian tubes. The risk of PID is slightly increased in the first 20 days after the IUD is placed, but the overall risk is still very low. BEFORE THE PROCEDURE  Schedule the IUD insertion for when you will have your menstrual period or right after, to make sure you are not pregnant. Placement of the IUD is better tolerated shortly after a menstrual cycle.  You may need to take tests or be examined to make sure you are not pregnant.  You may be required to take a pregnancy test.  You may be required to get checked for sexually transmitted infections (STIs) prior to placement. Placing an IUD in someone who has an infection can make the infection worse.  You may be given a pain reliever to take 1 or 2 hours before the procedure.  An exam will be performed to determine the size and position of your uterus.  Ask your health care provider about changing or stopping your regular medicines. PROCEDURE   A tool (speculum) is placed in the vagina. This allows your health care provider to see the lower part of the uterus (cervix).  The cervix is prepped with a medicine that lowers the risk of infection.  You may be given a medicine to numb each side of the cervix (intracervical or paracervical block). This is used to block and control any discomfort with insertion.  A tool (uterine sound) is inserted into the uterus to determine the length of the uterine cavity and the direction the uterus may be tilted.  A slim instrument (IUD inserter) is inserted through the cervical   canal and into your uterus.  The IUD is placed in the uterine cavity and the insertion device is removed.  The nylon string that is attached to the IUD and used for eventual IUD removal is trimmed. It is trimmed so that it lays high in the vagina, just outside the cervix. AFTER THE PROCEDURE  You may have bleeding after the procedure. This is normal. It varies from light spotting for a few days to menstrual-like  bleeding.  You may have mild cramping. This information is not intended to replace advice given to you by your health care provider. Make sure you discuss any questions you have with your health care provider. Document Released: 07/29/2011 Document Revised: 09/20/2013 Document Reviewed: 05/21/2013 Elsevier Interactive Patient Education  2017 Elsevier Inc.  

## 2016-12-09 ENCOUNTER — Other Ambulatory Visit: Payer: Self-pay | Admitting: Gynecology

## 2016-12-09 DIAGNOSIS — Z1231 Encounter for screening mammogram for malignant neoplasm of breast: Secondary | ICD-10-CM

## 2016-12-16 ENCOUNTER — Ambulatory Visit (INDEPENDENT_AMBULATORY_CARE_PROVIDER_SITE_OTHER): Payer: 59 | Admitting: Gynecology

## 2016-12-16 ENCOUNTER — Encounter: Payer: Self-pay | Admitting: Gynecology

## 2016-12-16 VITALS — BP 112/76

## 2016-12-16 DIAGNOSIS — Z30431 Encounter for routine checking of intrauterine contraceptive device: Secondary | ICD-10-CM

## 2016-12-16 NOTE — Patient Instructions (Signed)
Follow up for annual when due

## 2016-12-16 NOTE — Progress Notes (Signed)
    CINTIA ALLSTON [birth year] [id number]        46 y.o.  G2P2002 presents for follow up Mirena IUD check. Doing well without complaints.  Past medical history,surgical history, problem list, medications, allergies, family history and social history were all reviewed and documented in the EPIC chart.  Directed ROS with pertinent positives and negatives documented in the history of present illness/assessment and plan.  Exam: Caryn Bee assistant Vitals:   12/16/16 1607  BP: 112/76   General appearance:  Normal Abdomen soft nontender without masses guarding rebound Pelvic external BUS vagina normal. Cervix normal with IUD strings visualized in appropriate length. Uterus normal size midline mobile nontender. Adnexa without masses or tenderness.  Assessment/Plan:  46 y.o. [id number] with normal IUD check. Follow up in April 2018 when due for annual exam, sooner as needed.    Anastasio Auerbach MD, 4:27 PM 12/16/2016

## 2016-12-21 ENCOUNTER — Ambulatory Visit (INDEPENDENT_AMBULATORY_CARE_PROVIDER_SITE_OTHER): Payer: 59 | Admitting: Family Medicine

## 2016-12-21 ENCOUNTER — Encounter: Payer: Self-pay | Admitting: Family Medicine

## 2016-12-21 VITALS — BP 98/62 | HR 49 | Temp 98.5°F | Wt 133.8 lb

## 2016-12-21 DIAGNOSIS — N393 Stress incontinence (female) (male): Secondary | ICD-10-CM | POA: Diagnosis not present

## 2016-12-21 DIAGNOSIS — E559 Vitamin D deficiency, unspecified: Secondary | ICD-10-CM | POA: Diagnosis not present

## 2016-12-21 DIAGNOSIS — R1033 Periumbilical pain: Secondary | ICD-10-CM | POA: Diagnosis not present

## 2016-12-21 DIAGNOSIS — Z0000 Encounter for general adult medical examination without abnormal findings: Secondary | ICD-10-CM

## 2016-12-21 DIAGNOSIS — R109 Unspecified abdominal pain: Secondary | ICD-10-CM

## 2016-12-21 DIAGNOSIS — R32 Unspecified urinary incontinence: Secondary | ICD-10-CM | POA: Insufficient documentation

## 2016-12-21 HISTORY — DX: Vitamin D deficiency, unspecified: E55.9

## 2016-12-21 HISTORY — DX: Unspecified urinary incontinence: R32

## 2016-12-21 HISTORY — DX: Encounter for general adult medical examination without abnormal findings: Z00.00

## 2016-12-21 HISTORY — DX: Unspecified abdominal pain: R10.9

## 2016-12-21 LAB — COMPREHENSIVE METABOLIC PANEL
ALT: 11 U/L (ref 0–35)
AST: 16 U/L (ref 0–37)
Albumin: 4.2 g/dL (ref 3.5–5.2)
Alkaline Phosphatase: 34 U/L — ABNORMAL LOW (ref 39–117)
BUN: 15 mg/dL (ref 6–23)
CHLORIDE: 104 meq/L (ref 96–112)
CO2: 28 meq/L (ref 19–32)
Calcium: 9.3 mg/dL (ref 8.4–10.5)
Creatinine, Ser: 0.87 mg/dL (ref 0.40–1.20)
GFR: 73.94 mL/min (ref >60.00)
GLUCOSE: 86 mg/dL (ref 70–99)
POTASSIUM: 4.4 meq/L (ref 3.5–5.1)
Sodium: 138 mEq/L (ref 135–145)
Total Bilirubin: 0.7 mg/dL (ref 0.2–1.2)
Total Protein: 6.9 g/dL (ref 6.0–8.3)

## 2016-12-21 LAB — VITAMIN D 25 HYDROXY (VIT D DEFICIENCY, FRACTURES): VITD: 27.64 ng/mL — ABNORMAL LOW (ref 30.00–100.00)

## 2016-12-21 LAB — TSH: TSH: 1.94 u[IU]/mL (ref 0.35–4.50)

## 2016-12-21 MED ORDER — HYOSCYAMINE SULFATE 0.125 MG SL SUBL: 0.1250 mg | SUBLINGUAL_TABLET | SUBLINGUAL | 1 refills | Status: DC | PRN

## 2016-12-21 NOTE — Progress Notes (Signed)
Pre visit review using our clinic review tool, if applicable. No additional management support is needed unless otherwise documented below in the visit note. 

## 2016-12-21 NOTE — Progress Notes (Signed)
Subjective:    Patient ID: Valerie Jones, female    DOB: December 25, 1970, 46 y.o.   MRN: [id number]  Chief Complaint  Patient presents with  . Annual Exam    HPI Patient is in today for an annual exam patient c/o incontinence of urine with sneezing, exercise etc. No dysuria or hematuria. Also notes some intermittent abdominal pain. Roughly twice monthly has cramps mid abdomen no correlation to eating or not, no correlation to bowel movements that she can note. She is bent over in pain til she lies flat for aabout 15 minutes and then pain resolves. No change in bowel habits, anorexia, N/V. Denies CP/palp/SOB/HA/congestion/fevers. Taking meds as prescribed  Past Medical History:  Diagnosis Date  . Abdominal pain 12/21/2016  . GERD (gastroesophageal reflux disease)   . H/O atopic dermatitis 03/17/2015  . Hemorrhoids   . History of chicken pox   . Preventative health care 12/21/2016  . Urinary incontinence 12/21/2016  . Vitamin D deficiency 12/21/2016    Past Surgical History:  Procedure Laterality Date  . APPENDECTOMY  1988  . INTRAUTERINE DEVICE INSERTION  11/13/2016   MIRENA    Family History  Problem Relation Age of Onset  . Hypertension Father   . Stroke Father   . Atrial fibrillation Father   . Cancer Maternal Grandfather     Non Hodgkins Lymphoma  . Hypertension Sister   . Other Sister     prediabetes  . Mental illness Mother     anxiety  . Irritable bowel syndrome Mother   . COPD Maternal Grandmother     smoker    Social History   Social History  . Marital status: Married    Spouse name: N/A  . Number of children: N/A  . Years of education: N/A   Occupational History  . Not on file.   Social History Main Topics  . Smoking status: Never Smoker  . Smokeless tobacco: Never Used  . Alcohol use 0.0 oz/week     Comment: 2 NIGHTS A WEEK ... WINE  . Drug use: No  . Sexual activity: Yes    Birth control/ protection: IUD     Comment: Mirena inserted 11/13/2016,,Pt  declined sexual Hx    Other Topics Concern  . Not on file   Social History Narrative  . No narrative on file    No outpatient prescriptions prior to visit.   Facility-Administered Medications Prior to Visit  Medication Dose Route Frequency Provider Last Rate Last Dose  . levonorgestrel (MIRENA) 20 MCG/24HR IUD   Intrauterine Once Anastasio Auerbach, MD        Allergies  Allergen Reactions  . Clindamycin/Lincomycin Hives  . Sulfa Antibiotics Hives    Review of Systems  Constitutional: Negative for fever and malaise/fatigue.  HENT: Negative for congestion.   Eyes: Negative for blurred vision.  Respiratory: Negative for cough and shortness of breath.   Cardiovascular: Negative for chest pain, palpitations and leg swelling.  Gastrointestinal: Positive for abdominal pain. Negative for vomiting.  Genitourinary: Positive for frequency.  Musculoskeletal: Negative for back pain.  Skin: Negative for rash.  Neurological: Negative for loss of consciousness and headaches.       Objective:    Physical Exam  Constitutional: She is oriented to person, place, and time. She appears well-developed and well-nourished. No distress.  HENT:  Head: Normocephalic and atraumatic.  Eyes: Conjunctivae are normal.  Neck: Normal range of motion. No thyromegaly present.  Cardiovascular: Normal rate and regular rhythm.  Pulmonary/Chest: Effort normal and breath sounds normal. She has no wheezes.  Abdominal: Soft. Bowel sounds are normal. There is no tenderness.  Musculoskeletal: Normal range of motion. She exhibits no edema or deformity.  Neurological: She is alert and oriented to person, place, and time.  Skin: Skin is warm and dry. She is not diaphoretic.  Psychiatric: She has a normal mood and affect.    BP 98/62 (BP Location: Left Arm, Patient Position: Sitting, Cuff Size: Normal)   Pulse (!) 49   Temp 98.5 F (36.9 C) (Oral)   Wt 133 lb 12.8 oz (60.7 kg)   BMI 22.97 kg/m  Wt  Readings from Last 3 Encounters:  12/21/16 133 lb 12.8 oz (60.7 kg)  03/20/16 133 lb (60.3 kg)  03/16/16 130 lb (59 kg)     Lab Results  Component Value Date   WBC 4.5 03/20/2016   HGB 13.4 03/20/2016   HCT 40.5 03/20/2016   PLT 292 03/20/2016   GLUCOSE 58 (L) 03/20/2016   CHOL 161 03/20/2016   TRIG 75 03/20/2016   HDL 75 03/20/2016   LDLCALC 71 03/20/2016   ALT 11 03/20/2016   AST 15 03/20/2016   NA 139 03/20/2016   K 4.3 03/20/2016   CL 105 03/20/2016   CREATININE 0.76 03/20/2016   BUN 15 03/20/2016   CO2 29 03/20/2016   TSH 2.964 01/17/2013    Lab Results  Component Value Date   TSH 2.964 01/17/2013   Lab Results  Component Value Date   WBC 4.5 03/20/2016   HGB 13.4 03/20/2016   HCT 40.5 03/20/2016   MCV 95.7 03/20/2016   PLT 292 03/20/2016   Lab Results  Component Value Date   NA 139 03/20/2016   K 4.3 03/20/2016   CO2 29 03/20/2016   GLUCOSE 58 (L) 03/20/2016   BUN 15 03/20/2016   CREATININE 0.76 03/20/2016   BILITOT 0.4 03/20/2016   ALKPHOS 34 03/20/2016   AST 15 03/20/2016   ALT 11 03/20/2016   PROT 6.5 03/20/2016   ALBUMIN 4.4 03/20/2016   CALCIUM 9.2 03/20/2016   Lab Results  Component Value Date   CHOL 161 03/20/2016   Lab Results  Component Value Date   HDL 75 03/20/2016   Lab Results  Component Value Date   LDLCALC 71 03/20/2016   Lab Results  Component Value Date   TRIG 75 03/20/2016   Lab Results  Component Value Date   CHOLHDL 2.1 03/20/2016   No results found for: HGBA1C    I acted as a Education administrator for Dr. Charlett Blake. Princess, RMA  Assessment & Plan:   Problem List Items Addressed This Visit    Urinary incontinence    Stress with exercise, sneezing etc. Hydrating well encouraged kegel exercises twice daily      Preventative health care    Patient encouraged to maintain heart healthy diet, regular exercise, adequate sleep. Consider daily probiotics. Take medications as prescribed. Given and reviewed copy of ACP documents  from [place] and encouraged to complete and return      Relevant Orders   TSH   Comprehensive metabolic panel   Abdominal pain    Roughly twice monthly has cramps mid abdomen. Try Hyoscyamine to try prn. encouraged probiotics, fiber supplements, report worsening symptoms      Relevant Orders   TSH   Comprehensive metabolic panel   Vitamin D deficiency    Encouraged to take a daily supplements and check level today  Relevant Orders   Vitamin D (25 hydroxy)      I am having Ms. Balsley start on hyoscyamine. We will continue to administer levonorgestrel.  Meds ordered this encounter  Medications  . hyoscyamine (LEVSIN SL) 0.125 MG SL tablet    Sig: Place 1 tablet (0.125 mg total) under the tongue every 4 (four) hours as needed.    Dispense:  30 tablet    Refill:  1    CMA served as scribe during this visit. History, Physical and Plan performed by medical provider. Documentation and orders reviewed and attested to.  Penni Homans, MD

## 2016-12-21 NOTE — Patient Instructions (Signed)
NOW supplements at [email or web address]. 10 strain caps daily.    Preventive Care 40-64 Years, Female Preventive care refers to lifestyle choices and visits with your health care provider that can promote health and wellness. What does preventive care include?  A yearly physical exam. This is also called an annual well check.  Dental exams once or twice a year.  Routine eye exams. Ask your health care provider how often you should have your eyes checked.  Personal lifestyle choices, including:  Daily care of your teeth and gums.  Regular physical activity.  Eating a healthy diet.  Avoiding tobacco and drug use.  Limiting alcohol use.  Practicing safe sex.  Taking low-dose aspirin daily starting at age 60.  Taking vitamin and mineral supplements as recommended by your health care provider. What happens during an annual well check? The services and screenings done by your health care provider during your annual well check will depend on your age, overall health, lifestyle risk factors, and family history of disease. Counseling  Your health care provider may ask you questions about your:  Alcohol use.  Tobacco use.  Drug use.  Emotional well-being.  Home and relationship well-being.  Sexual activity.  Eating habits.  Work and work Statistician.  Method of birth control.  Menstrual cycle.  Pregnancy history. Screening  You may have the following tests or measurements:  Height, weight, and BMI.  Blood pressure.  Lipid and cholesterol levels. These may be checked every 5 years, or more frequently if you are over 23 years old.  Skin check.  Lung cancer screening. You may have this screening every year starting at age 45 if you have a 30-pack-year history of smoking and currently smoke or have quit within the past 15 years.  Fecal occult blood test (FOBT) of the stool. You may have this test every year starting at age 17.  Flexible sigmoidoscopy or  colonoscopy. You may have a sigmoidoscopy every 5 years or a colonoscopy every 10 years starting at age 26.  Hepatitis C blood test.  Hepatitis B blood test.  Sexually transmitted disease (STD) testing.  Diabetes screening. This is done by checking your blood sugar (glucose) after you have not eaten for a while (fasting). You may have this done every 1-3 years.  Mammogram. This may be done every 1-2 years. Talk to your health care provider about when you should start having regular mammograms. This may depend on whether you have a family history of breast cancer.  BRCA-related cancer screening. This may be done if you have a family history of breast, ovarian, tubal, or peritoneal cancers.  Pelvic exam and Pap test. This may be done every 3 years starting at age 29. Starting at age 12, this may be done every 5 years if you have a Pap test in combination with an HPV test.  Bone density scan. This is done to screen for osteoporosis. You may have this scan if you are at high risk for osteoporosis. Discuss your test results, treatment options, and if necessary, the need for more tests with your health care provider. Vaccines  Your health care provider may recommend certain vaccines, such as:  Influenza vaccine. This is recommended every year.  Tetanus, diphtheria, and acellular pertussis (Tdap, Td) vaccine. You may need a Td booster every 10 years.  Varicella vaccine. You may need this if you have not been vaccinated.  Zoster vaccine. You may need this after age 33.  Measles, mumps, and rubella (MMR) vaccine. You may  need at least one dose of MMR if you were born in 1957 or later. You may also need a second dose.  Pneumococcal 13-valent conjugate (PCV13) vaccine. You may need this if you have certain conditions and were not previously vaccinated.  Pneumococcal polysaccharide (PPSV23) vaccine. You may need one or two doses if you smoke cigarettes or if you have certain  conditions.  Meningococcal vaccine. You may need this if you have certain conditions.  Hepatitis A vaccine. You may need this if you have certain conditions or if you travel or work in places where you may be exposed to hepatitis A.  Hepatitis B vaccine. You may need this if you have certain conditions or if you travel or work in places where you may be exposed to hepatitis B.  Haemophilus influenzae type b (Hib) vaccine. You may need this if you have certain conditions. Talk to your health care provider about which screenings and vaccines you need and how often you need them. This information is not intended to replace advice given to you by your health care provider. Make sure you discuss any questions you have with your health care provider. Document Released: 12/27/2015 Document Revised: 08/19/2016 Document Reviewed: 10/01/2015 Elsevier Interactive Patient Education  2017 [employer].

## 2016-12-21 NOTE — Assessment & Plan Note (Addendum)
Roughly twice monthly has cramps mid abdomen. Try Hyoscyamine to try prn. encouraged probiotics, fiber supplements, report worsening symptoms

## 2016-12-21 NOTE — Assessment & Plan Note (Signed)
Encouraged to take a daily supplements and check level today

## 2016-12-21 NOTE — Assessment & Plan Note (Signed)
Stress with exercise, sneezing etc. Hydrating well encouraged kegel exercises twice daily

## 2016-12-21 NOTE — Assessment & Plan Note (Signed)
Patient encouraged to maintain heart healthy diet, regular exercise, adequate sleep. Consider daily probiotics. Take medications as prescribed. Given and reviewed copy of ACP documents from [place] Secretary of State and encouraged to complete and return 

## 2016-12-22 ENCOUNTER — Other Ambulatory Visit: Payer: Self-pay

## 2016-12-22 DIAGNOSIS — E559 Vitamin D deficiency, unspecified: Secondary | ICD-10-CM

## 2017-01-07 ENCOUNTER — Ambulatory Visit
Admission: RE | Admit: 2017-01-07 | Discharge: 2017-01-07 | Disposition: A | Discharge status: Home / Self Care | Payer: 59 | Source: Ambulatory Visit | Attending: Gynecology | Admitting: Gynecology

## 2017-01-07 DIAGNOSIS — Z1231 Encounter for screening mammogram for malignant neoplasm of breast: Secondary | ICD-10-CM

## 2017-02-10 ENCOUNTER — Telehealth: Payer: Self-pay | Admitting: *Deleted

## 2017-02-10 NOTE — Telephone Encounter (Signed)
Pt has questions about Mirena IUD side effect noticed increased acne, cramping off and on, no pain. Will watch for now and follow up if needed.

## 2017-03-14 DIAGNOSIS — R87610 Atypical squamous cells of undetermined significance on cytologic smear of cervix (ASC-US): Secondary | ICD-10-CM

## 2017-03-14 HISTORY — DX: Atypical squamous cells of undetermined significance on cytologic smear of cervix (ASC-US): R87.610

## 2017-03-22 ENCOUNTER — Encounter: Payer: Self-pay | Admitting: Gynecology

## 2017-03-22 ENCOUNTER — Ambulatory Visit (INDEPENDENT_AMBULATORY_CARE_PROVIDER_SITE_OTHER): Payer: 59 | Admitting: Gynecology

## 2017-03-22 VITALS — BP 110/62 | Ht 64.0 in | Wt 132.0 lb

## 2017-03-22 DIAGNOSIS — R87610 Atypical squamous cells of undetermined significance on cytologic smear of cervix (ASC-US): Secondary | ICD-10-CM | POA: Diagnosis not present

## 2017-03-22 DIAGNOSIS — Z30431 Encounter for routine checking of intrauterine contraceptive device: Secondary | ICD-10-CM | POA: Diagnosis not present

## 2017-03-22 DIAGNOSIS — Z1151 Encounter for screening for human papillomavirus (HPV): Secondary | ICD-10-CM | POA: Diagnosis not present

## 2017-03-22 DIAGNOSIS — Z01419 Encounter for gynecological examination (general) (routine) without abnormal findings: Secondary | ICD-10-CM

## 2017-03-22 NOTE — Patient Instructions (Signed)

## 2017-03-22 NOTE — Addendum Note (Signed)
Addended by: Nelva Nay on: 03/22/2017 03:18 PM   Modules accepted: Orders

## 2017-03-22 NOTE — Progress Notes (Signed)
    Valerie Jones [birth year] [id number]        46 y.o.  G2P2002 for annual exam.    Past medical history,surgical history, problem list, medications, allergies, family history and social history were all reviewed and documented as reviewed in the EPIC chart.  ROS:  Performed with pertinent positives and negatives included in the history, assessment and plan.   Additional significant findings :  None   Exam: Caryn Bee assistant Vitals:   03/22/17 1412  BP: 110/62  Weight: 132 lb (59.9 kg)  Height: 5\' 4"  (1.626 m)   Body mass index is 22.66 kg/m.  General appearance:  Normal affect, orientation and appearance. Skin: Grossly normal HEENT: Without gross lesions.  No cervical or supraclavicular adenopathy. Thyroid normal.  Lungs:  Clear without wheezing, rales or rhonchi Cardiac: RR, without RMG Abdominal:  Soft, nontender, without masses, guarding, rebound, organomegaly or hernia Breasts:  Examined lying and sitting without masses, retractions, discharge or axillary adenopathy. Pelvic:  Ext, BUS, Vagina: Normal  Cervix: Normal with IUD string visualized. Pap smear/HPV  Uterus: Anteverted, normal size, shape and contour, midline and mobile nontender   Adnexa: Without masses or tenderness    Anus and perineum: Normal   Rectovaginal: Normal sphincter tone without palpated masses or tenderness.    Assessment/Plan:  46 y.o. G36P2002 female for annual exam without menses, Mirena IUD.   1. Mirena IUD 11/2016. Doing well without menses. IUD string visualized. 2. Mammography 12/2016. Continue with annual mammography when due. SBE monthly reviewed. 3. Pap smear/HPV 2014. Pap smear/HPV done today. No history of abnormal Pap smears previously. 4. Health maintenance. Patient has had blood work done over the past 1-2 years all which have been normal as documented in the Epic. No blood work done today. Follow up in one year, sooner as needed.   Anastasio Auerbach MD, 3:05 PM  03/22/2017

## 2017-03-25 ENCOUNTER — Encounter: Payer: Self-pay | Admitting: Gynecology

## 2017-03-25 LAB — PAP IG AND HPV HIGH-RISK: HPV DNA High Risk: NOT DETECTED

## 2017-05-20 DIAGNOSIS — Z0100 Encounter for examination of eyes and vision without abnormal findings: Secondary | ICD-10-CM | POA: Diagnosis not present

## 2017-05-24 ENCOUNTER — Other Ambulatory Visit (INDEPENDENT_AMBULATORY_CARE_PROVIDER_SITE_OTHER): Payer: 59

## 2017-05-24 DIAGNOSIS — E559 Vitamin D deficiency, unspecified: Secondary | ICD-10-CM

## 2017-05-24 LAB — VITAMIN D 25 HYDROXY (VIT D DEFICIENCY, FRACTURES): VITD: 29.05 ng/mL — ABNORMAL LOW (ref 30.00–100.00)

## 2017-05-25 ENCOUNTER — Other Ambulatory Visit: Payer: Self-pay | Admitting: Family Medicine

## 2017-05-25 MED ORDER — VITAMIN D (ERGOCALCIFEROL) 1.25 MG (50000 UNIT) PO CAPS: 50000.0000 [IU] | ORAL_CAPSULE | ORAL | 0 refills | Status: DC

## 2017-07-20 ENCOUNTER — Ambulatory Visit (INDEPENDENT_AMBULATORY_CARE_PROVIDER_SITE_OTHER): Payer: 59 | Admitting: Sports Medicine

## 2017-07-20 VITALS — BP 108/60 | Ht 64.0 in | Wt 130.0 lb

## 2017-07-20 DIAGNOSIS — S76319A Strain of muscle, fascia and tendon of the posterior muscle group at thigh level, unspecified thigh, initial encounter: Secondary | ICD-10-CM

## 2017-07-20 NOTE — Progress Notes (Signed)
   Subjective:    Valerie Jones - 46 y.o. female MRN [id number]  Date of birth: Sep 19, 1971  CC: Right leg pain    HPI  Valerie Jones is here for right posterior upper leg pain since March. Pain is located in the right upper posterior thigh and into the gluteus region. Pain was initially present with running but is now a low grade pulling sensation/aching sensation almost constantly. Rest improves the severity of the pain and running worsens the pain. No known specific injury and does not remember what she was doing when she first noticed the pain.  She has begun hip abductor exercises again about 1 month ago that she was initially prescribed for IT band syndrome. Additionally, she is now had 2-3 episodes of right anterior knee pain with running. Knee pain seems to be exacerbated by increased distance in runs (>3 miles) and running up/down hills or stairs.   ROS: No erythema or edema of knee. No popping or catching of knee. Sensation of generalized weakness of right leg. No numbness or tingling of right LE.     - Review of Systems: Per HPI.   reports that she has never smoked. She has never used smokeless tobacco. - Past Medical History: Patient Active Problem List   Diagnosis Date Noted  . Urinary incontinence 12/21/2016  . Preventative health care 12/21/2016  . Abdominal pain 12/21/2016  . Vitamin D deficiency 12/21/2016  . Nasal vestibulitis 05/01/2015  . H/O atopic dermatitis 03/17/2015  . External hemorrhoids 02/01/2014      Objective:   Physical Exam Blood pressure 108/60, height 5\' 4"  (1.626 m), weight 130 lb (59 kg). Gen: NAD, alert, cooperative with exam, well-appearing Knee: No edema or erythema of right knee. No TTP over anterior knee. Full ROM. No medial or lateral joint line tenderness. No laxity of MCL or LCL. Negative anterior drawer test.  Ext: No TTP over right gluteus or hamstring. Full ROM at hips bilaterally. Pain reproduced with hamstring curl at 90 degrees.  No pain at 30 degrees. Strength of right hamstring slightly less than left. Hip abductor strength is intact and equal bilaterally.     Assessment & Plan:  Right Hamstring Tendinopathy: Given that pain is reproduced with hamstring curl at 90 degrees suspect high hamstring injury. Likely tendinopathy given chronic nature of pain.  -hamstring strengthening exercises  -running as tolerated  -NSAIDs prn  -return in 3-4 weeks with running shoes to evaluate running gait   Right Anterior Knee Pain: Unknown etiology given benign knee exam but suspect related to compensatory running style with hamstring pain as it has only recently started and only occurs with exercising.  -will monitor for improvement with strengthening of hamstring   Phill Myron, D.O. 07/20/2017, 9:41 AM PGY-3, [place]  Patient seen and evaluated with the resident. I agree with the above plan of care. Patient will start [place] exercises at home and may continue running using pain as her guide. We discussed risk factors for this injury including over striding. When she returns for follow-up in 3-4 weeks she will bring her running shoes and we will do a gait analysis. I think the right knee pain is compensatory. Her knee exam is unremarkable today. She does not endorse any swelling.

## 2017-08-12 ENCOUNTER — Encounter: Payer: Self-pay | Admitting: Family Medicine

## 2017-08-12 ENCOUNTER — Ambulatory Visit (INDEPENDENT_AMBULATORY_CARE_PROVIDER_SITE_OTHER): Payer: 59 | Admitting: Family Medicine

## 2017-08-12 VITALS — BP 100/58 | HR 50 | Temp 98.0°F | Wt 133.8 lb

## 2017-08-12 DIAGNOSIS — K2100 Gastro-esophageal reflux disease with esophagitis, without bleeding: Secondary | ICD-10-CM

## 2017-08-12 DIAGNOSIS — K529 Noninfective gastroenteritis and colitis, unspecified: Secondary | ICD-10-CM

## 2017-08-12 DIAGNOSIS — R1013 Epigastric pain: Secondary | ICD-10-CM | POA: Diagnosis not present

## 2017-08-12 LAB — H. PYLORI ANTIBODY, IGG: H Pylori IgG: NEGATIVE

## 2017-08-12 MED ORDER — OMEPRAZOLE 40 MG PO CPDR: 40.0000 mg | DELAYED_RELEASE_CAPSULE | Freq: Every day | ORAL | 3 refills | Status: DC

## 2017-08-12 MED FILL — OMEPRAZOLE DR 40 MG CAPSULE: 40 | 30 days supply | Qty: 30 | Fill #0

## 2017-08-12 NOTE — Patient Instructions (Signed)
Ranitidine 150 mg at bed if symptoms do not improve on Omeprazole Food Choices for Gastroesophageal Reflux Disease, Adult When you have gastroesophageal reflux disease (GERD), the foods you eat and your eating habits are very important. Choosing the right foods can help ease your discomfort. What guidelines do I need to follow?  Choose fruits, vegetables, whole grains, and low-fat dairy products.  Choose low-fat meat, fish, and poultry.  Limit fats such as oils, salad dressings, butter, nuts, and avocado.  Keep a food diary. This helps you identify foods that cause symptoms.  Avoid foods that cause symptoms. These may be different for everyone.  Eat small meals often instead of 3 large meals a day.  Eat your meals slowly, in a place where you are relaxed.  Limit fried foods.  Cook foods using methods other than frying.  Avoid drinking alcohol.  Avoid drinking large amounts of liquids with your meals.  Avoid bending over or lying down until 2-3 hours after eating. What foods are not recommended? These are some foods and drinks that may make your symptoms worse: Vegetables Tomatoes. Tomato juice. Tomato and spaghetti sauce. Chili peppers. Onion and garlic. Horseradish. Fruits Oranges, grapefruit, and lemon (fruit and juice). Meats High-fat meats, fish, and poultry. This includes hot dogs, ribs, ham, sausage, salami, and bacon. Dairy Whole milk and chocolate milk. Sour cream. Cream. Butter. Ice cream. Cream cheese. Drinks Coffee and tea. Bubbly (carbonated) drinks or energy drinks. Condiments Hot sauce. Barbecue sauce. Sweets/Desserts Chocolate and cocoa. Donuts. Peppermint and spearmint. Fats and Oils High-fat foods. This includes [place] fries and potato chips. Other Vinegar. Strong spices. This includes black pepper, white pepper, red pepper, cayenne, curry powder, cloves, ginger, and chili powder. The items listed above may not be a complete list of foods and drinks to  avoid. Contact your dietitian for more information. This information is not intended to replace advice given to you by your health care provider. Make sure you discuss any questions you have with your health care provider. Document Released: 05/31/2012 Document Revised: 05/07/2016 Document Reviewed: 10/04/2013 Elsevier Interactive Patient Education  2017 [employer].

## 2017-08-16 ENCOUNTER — Encounter: Payer: Self-pay | Admitting: Family Medicine

## 2017-08-16 DIAGNOSIS — K219 Gastro-esophageal reflux disease without esophagitis: Secondary | ICD-10-CM

## 2017-08-16 HISTORY — DX: Gastro-esophageal reflux disease without esophagitis: K21.9

## 2017-08-16 NOTE — Progress Notes (Signed)
Subjective:    Patient ID: Valerie Jones, female    DOB: 1971-08-23, 46 y.o.   MRN: [id number]  Chief Complaint  Patient presents with  . Gastroesophageal Reflux    X76months     HPI Patient is in today for evaluation of worsening reflux. She has noted recurrent symptoms each day which respond to [employer] temporarily but then symptoms return. She has tried Tums and Rolaids but they have not helped. In the past couple of days she has noted sore throat a sense of globus and a nonproductive cough. She has episodes most days of the week and the symptoms have been worsening for a couple of months. Denies CP/palp/SOB/HA/congestion/fevers or GU c/o. Taking meds as prescribed  Past Medical History:  Diagnosis Date  . Abdominal pain 12/21/2016  . Acid reflux 08/16/2017  . ASCUS of cervix with negative high risk HPV 03/2017  . GERD (gastroesophageal reflux disease)   . H/O atopic dermatitis 03/17/2015  . Hemorrhoids   . History of chicken pox   . Preventative health care 12/21/2016  . Urinary incontinence 12/21/2016  . Vitamin D deficiency 12/21/2016    Past Surgical History:  Procedure Laterality Date  . APPENDECTOMY  1988  . INTRAUTERINE DEVICE INSERTION  11/13/2016   MIRENA    Family History  Problem Relation Age of Onset  . Hypertension Father   . Stroke Father   . Atrial fibrillation Father   . Cancer Maternal Grandfather        Non Hodgkins Lymphoma  . Hypertension Sister   . Other Sister        prediabetes  . Mental illness Mother        anxiety  . Irritable bowel syndrome Mother   . COPD Maternal Grandmother        smoker    Social History   Social History  . Marital status: Married    Spouse name: N/A  . Number of children: N/A  . Years of education: N/A   Occupational History  . Not on file.   Social History Main Topics  . Smoking status: Never Smoker  . Smokeless tobacco: Never Used  . Alcohol use 0.0 oz/week     Comment: 2 NIGHTS A WEEK ... WINE  . Drug  use: No  . Sexual activity: Yes    Birth control/ protection: IUD     Comment: Mirena inserted 11/13/2016,,Pt declined sexual Hx    Other Topics Concern  . Not on file   Social History Narrative  . No narrative on file    Outpatient Medications Prior to Visit  Medication Sig Dispense Refill  . Vitamin D, Ergocalciferol, (DRISDOL) 50000 units CAPS capsule Take 1 capsule (50,000 Units total) by mouth every 7 (seven) days. 12 capsule 0   Facility-Administered Medications Prior to Visit  Medication Dose Route Frequency Provider Last Rate Last Dose  . levonorgestrel (MIRENA) 20 MCG/24HR IUD   Intrauterine Once Fontaine, Belinda Block, MD        Allergies  Allergen Reactions  . Clindamycin/Lincomycin Hives  . Sulfa Antibiotics Hives    Review of Systems  Constitutional: Negative for fever and malaise/fatigue.  HENT: Negative for congestion.   Eyes: Negative for blurred vision.  Respiratory: Negative for shortness of breath.   Cardiovascular: Negative for chest pain, palpitations and leg swelling.  Gastrointestinal: Positive for abdominal pain and heartburn. Negative for blood in stool, constipation, diarrhea, melena, nausea and vomiting.  Genitourinary: Negative for dysuria and frequency.  Musculoskeletal:  Negative for falls.  Skin: Negative for rash.  Neurological: Negative for dizziness, loss of consciousness and headaches.  Endo/Heme/Allergies: Negative for environmental allergies.  Psychiatric/Behavioral: Negative for depression. The patient is not nervous/anxious.        Objective:    Physical Exam  Constitutional: She is oriented to person, place, and time. She appears well-developed and well-nourished. No distress.  HENT:  Head: Normocephalic and atraumatic.  Nose: Nose normal.  Eyes: Right eye exhibits no discharge. Left eye exhibits no discharge.  Neck: Normal range of motion. Neck supple.  Cardiovascular: Normal rate and regular rhythm.   No murmur  heard. Pulmonary/Chest: Effort normal and breath sounds normal.  Abdominal: Soft. Bowel sounds are normal. There is no tenderness.  Musculoskeletal: She exhibits no edema.  Neurological: She is alert and oriented to person, place, and time.  Skin: Skin is warm and dry.  Psychiatric: She has a normal mood and affect.  Nursing note and vitals reviewed.   BP (!) 100/58 (BP Location: Left Arm, Patient Position: Sitting, Cuff Size: Normal)   Pulse (!) 50   Temp 98 F (36.7 C) (Oral)   Wt 133 lb 12.8 oz (60.7 kg)   SpO2 95%   BMI 22.97 kg/m  Wt Readings from Last 3 Encounters:  08/12/17 133 lb 12.8 oz (60.7 kg)  07/20/17 130 lb (59 kg)  03/22/17 132 lb (59.9 kg)     Lab Results  Component Value Date   WBC 4.5 03/20/2016   HGB 13.4 03/20/2016   HCT 40.5 03/20/2016   PLT 292 03/20/2016   GLUCOSE 86 12/21/2016   CHOL 161 03/20/2016   TRIG 75 03/20/2016   HDL 75 03/20/2016   LDLCALC 71 03/20/2016   ALT 11 12/21/2016   AST 16 12/21/2016   NA 138 12/21/2016   K 4.4 12/21/2016   CL 104 12/21/2016   CREATININE 0.87 12/21/2016   BUN 15 12/21/2016   CO2 28 12/21/2016   TSH 1.94 12/21/2016    Lab Results  Component Value Date   TSH 1.94 12/21/2016   Lab Results  Component Value Date   WBC 4.5 03/20/2016   HGB 13.4 03/20/2016   HCT 40.5 03/20/2016   MCV 95.7 03/20/2016   PLT 292 03/20/2016   Lab Results  Component Value Date   NA 138 12/21/2016   K 4.4 12/21/2016   CO2 28 12/21/2016   GLUCOSE 86 12/21/2016   BUN 15 12/21/2016   CREATININE 0.87 12/21/2016   BILITOT 0.7 12/21/2016   ALKPHOS 34 (L) 12/21/2016   AST 16 12/21/2016   ALT 11 12/21/2016   PROT 6.9 12/21/2016   ALBUMIN 4.2 12/21/2016   CALCIUM 9.3 12/21/2016   GFR 73.94 12/21/2016   Lab Results  Component Value Date   CHOL 161 03/20/2016   Lab Results  Component Value Date   HDL 75 03/20/2016   Lab Results  Component Value Date   LDLCALC 71 03/20/2016   Lab Results  Component Value Date    TRIG 75 03/20/2016   Lab Results  Component Value Date   CHOLHDL 2.1 03/20/2016   No results found for: HGBA1C     Assessment & Plan:   Problem List Items Addressed This Visit    Abdominal pain - Primary   Relevant Orders   H. pylori antibody, IgG (Completed)   Ambulatory referral to Gastroenterology   Acid reflux    Recent escalation of symptoms. H Pylori is negative. She is using [employer], advertising frequently and it  helps but then symptoms return. Started on Omeprazole and referred to Dr Collene Mares of Gastroenterology at her request. Avoid offending foods, start probiotics. Do not eat large meals in late evening and consider raising head of bed. Notify us if symptoms continue to worsen      Relevant Medications   omeprazole (PRILOSEC) 40 MG capsule    Other Visit Diagnoses    Gastroenteritis       Relevant Orders   H. pylori antibody, IgG (Completed)   Ambulatory referral to Gastroenterology      I am having Ms. Graig maintain her Vitamin D (Ergocalciferol) and omeprazole. We will continue to administer levonorgestrel.  Meds ordered this encounter  Medications  . DISCONTD: omeprazole (PRILOSEC) 40 MG capsule    Sig: Take 1 capsule (40 mg total) by mouth daily.    Dispense:  30 capsule    Refill:  3  . omeprazole (PRILOSEC) 40 MG capsule    Sig: Take 1 capsule (40 mg total) by mouth daily.    Dispense:  30 capsule    Refill:  3    Penni Homans, MD

## 2017-08-16 NOTE — Assessment & Plan Note (Signed)
Recent escalation of symptoms. H Pylori is negative. She is using Copywriter, advertising frequently and it helps but then symptoms return. Started on Omeprazole and referred to Dr Collene Mares of Gastroenterology at her request. Avoid offending foods, start probiotics. Do not eat large meals in late evening and consider raising head of bed. Notify us if symptoms continue to worsen

## 2017-08-17 ENCOUNTER — Ambulatory Visit (INDEPENDENT_AMBULATORY_CARE_PROVIDER_SITE_OTHER): Payer: 59 | Admitting: Sports Medicine

## 2017-08-17 VITALS — BP 110/68 | Ht 64.0 in | Wt 130.0 lb

## 2017-08-17 DIAGNOSIS — S76311D Strain of muscle, fascia and tendon of the posterior muscle group at thigh level, right thigh, subsequent encounter: Secondary | ICD-10-CM | POA: Diagnosis not present

## 2017-08-17 NOTE — Progress Notes (Signed)
   [facility] [address] [place], [place] [address] Phone: [phone] Fax: [phone]   Patient Name: Valerie Jones Date of Birth: [birth year] Medical Record Number: [id number] Gender: female Date of Encounter: 08/17/2017  History of Present Illness:  Valerie Jones is a 46 y.o. very pleasant female patient who was last seen on 07/20/2017 and  presents for follow-up of right posterior upper leg pain that she first developed in March 2018.  Initially developed pain in base of R buttock.  Pain has not improved. Reports 2/10 at baseline and has been taking it easy. Has been able to run 1.5 miles at most. Previously running 6 miles four days per week. No numbness or tingling in lower extremities. First few weeks has after last visit had been working on hamstring exercises. Has not tried any OTC medication. Additionally has a right anterior knee pain that was thought to be secondary to compensatory running style. Denies any knee swelling, instability, locking or popping.    Past Medical, Surgical, Social, and Family History Reviewed. Medications and Allergies reviewed and all updated if necessary.  Review of Systems:  Denies any numbness, tingling, weakness, knee swelling, instability, locking or popping  Physical Examination: Vitals:   08/17/17 1059  BP: 110/68   Vitals:   08/17/17 1059  Weight: 130 lb (59 kg)  Height: 5\' 4"  (1.626 m)   Body mass index is 22.31 kg/m.  General: well appearing 46 year old female in in NAD Cardiac: well perfused Resp: NWOB MSK:   Knee: no gross deformities, swelling, medial or lateral joint line tenderness, or medial deviation of patella with knee extension Hips: no tenderness to palpation along the IT band, R-sided resisted hamstring curls did elicit pain in the midportion of hamstring as well as base of the buttock Neuro: grossly normal, no changes in sensation, strength 4/5 on resisted hip abduction  bilaterally  Assessment and Plan: Right-sided hamstring tendinopathy No improvement in symptoms after a few weeks of home exercises. Now has baseline pain at 2/10 with normal ambulation during work. Does have decreased strength in bilateral hip abductors and continues with pain at base of buttock on the right side with resisted hamstring curls. Discussed importance of physical therapy at this point to help with strengthening hip abductors and rehabilitation of hamstring. Patient understands that hamstring rehabilitation may take quite a while. - hold off on home exercises until evaluated by physical therapy - May try slow running on flat surfaces - Follow-up in 6 weeks

## 2017-08-23 ENCOUNTER — Ambulatory Visit: Payer: 59 | Attending: Sports Medicine | Admitting: Physical Therapy

## 2017-08-23 ENCOUNTER — Encounter: Payer: Self-pay | Admitting: Physical Therapy

## 2017-08-23 DIAGNOSIS — M79604 Pain in right leg: Secondary | ICD-10-CM | POA: Diagnosis not present

## 2017-08-23 DIAGNOSIS — M6281 Muscle weakness (generalized): Secondary | ICD-10-CM | POA: Diagnosis not present

## 2017-08-23 NOTE — Therapy (Signed)
[place], [place], [address] Phone: [phone]   Fax:  [phone]  Physical Therapy Evaluation  Patient Details  Name: Valerie Jones MRN: [id number] Date of Birth: December 09, 1971 Referring Provider: Dr. Micheline Chapman  Encounter Date: 08/23/2017      PT End of Session - 08/23/17 1535    Visit Number 1   Number of Visits 8   Date for PT Re-Evaluation 10/18/17   PT Start Time 9833   PT Stop Time 1510   PT Time Calculation (min) 50 min   Activity Tolerance Patient tolerated treatment well   Behavior During Therapy [facility] for tasks assessed/performed      Past Medical History:  Diagnosis Date  . Abdominal pain 12/21/2016  . Acid reflux 08/16/2017  . ASCUS of cervix with negative high risk HPV 03/2017  . GERD (gastroesophageal reflux disease)   . H/O atopic dermatitis 03/17/2015  . Hemorrhoids   . History of chicken pox   . Preventative health care 12/21/2016  . Urinary incontinence 12/21/2016  . Vitamin D deficiency 12/21/2016    Past Surgical History:  Procedure Laterality Date  . APPENDECTOMY  1988  . INTRAUTERINE DEVICE INSERTION  11/13/2016   MIRENA    There were no vitals filed for this visit.       Subjective Assessment - 08/23/17 1424    Subjective Pt reports about a 3 month history of Rt. hamstring pain. She has modified her normal workout schedule which has been running up to 4-5 miles, 3 days per week.  She does yoga 1 x per week.  runs 1 x per week to "test" her leg.  Has some Rt. knee pain when running at times.  Pain does not interrupt her work but she does have daily discomfort.     Limitations --  fitness ,running    Diagnostic tests None    Patient Stated Goals Avoid injury and return to normal running, has considered 1/2 marathon    Currently in Pain? Yes   Pain Score 2    Pain Location Leg   Pain Orientation Right;Proximal;Posterior   Pain Type Chronic pain   Pain Onset More than a month  ago   Pain Frequency Intermittent   Aggravating Factors  running, overactivity , squatting    Pain Relieving Factors rest , does not take meds, ice or heat             [facility] PT Assessment - 08/23/17 0001      Assessment   Medical Diagnosis Rt. hamstring strain    Referring Provider Dr. Micheline Chapman   Onset Date/Surgical Date --  3 mos   Prior Therapy No      Precautions   Precautions None     Balance Screen   Has the patient fallen in the past 6 months No     [place] residence     Prior Function   Level of Independence Independent     Cognition   Overall Cognitive Status Within Functional Limits for tasks assessed     Observation/Other Assessments   Focus on Therapeutic Outcomes (FOTO)  32%     Sensation   Light Touch Appears Intact     Functional Tests   Functional tests Squat;Step up;Step down;Single leg stance     Squat   Comments WFL mild strain     Step Up   Comments WFL     Step Down   Comments  knee adducts on Rt., pelvic instability      Single Leg Stance   Comments WFL     Posture/Postural Control   Posture Comments mild swayback , Rt. pelvis very slightly posterior innominate     AROM   Right/Left Hip --  WFL   Right/Left Knee --  WFL      PROM   Overall PROM Comments WNL bilateral hips      Strength   Right Hip Flexion 4+/5   Right Hip Extension 4+/5   Right Hip ABduction 3+/5   Right Hip ADduction 4/5   Left Hip Flexion 4+/5   Left Hip Extension 5/5   Left Hip ABduction 4+/5   Left Hip ADduction 4+/5   Right Knee Flexion 4+/5   Right Knee Extension 5/5   Left Knee Flexion 4+/5   Left Knee Extension 5/5     Flexibility   Hamstrings Rt. 32 L. 26 in 90/90 test      Palpation   Palpation comment TTP Rt. hamstring mid muscle belly and mild up into ischial tuberosity            Objective measurements completed on examination: See above findings.          [facility]  Treatment/Exercise - 08/23/17 0001      Knee/Hip Exercises: Stretches   Active Hamstring Stretch Right;2 reps   ITB Stretch Right;2 reps     Knee/Hip Exercises: Sidelying   Hip ABduction Strengthening;Right;1 set   Hip ADduction Strengthening;Right;1 set   Clams blue band x 20 Rt. leg                PT Education - 08/23/17 1534    Education provided Yes   Education Details PT/POC, dry needling, HEP, modifications for exercise    Person(s) Educated Patient   Methods Explanation;Demonstration;Tactile cues   Comprehension Verbalized understanding;Returned demonstration             PT Long Term Goals - 08/23/17 1542      PT LONG TERM GOAL #1   Title Pt will be I with HEP    Time 6   Period Weeks   Status New   Target Date 10/04/17     PT LONG TERM GOAL #2   Title Pt will be able to report no discomfort during the work day, even with climbing stairs    Time 6   Period Weeks   Status New   Target Date 10/04/17     PT LONG TERM GOAL #3   Title Pt will be able to demo increased hamstring length 5 deg each leg    Time 6   Period Weeks   Status New   Target Date 10/04/17     PT LONG TERM GOAL #4   Title Pt will be able to run 2 miles with no increase in knee pain, hamstring pain in order to prepare for longer distances.    Time 6   Period Weeks   Status New   Target Date 10/04/17                Plan - 08/23/17 1535    Clinical Impression Statement Pt with low complexity eval of Rt. sided hamstring strain which has now become chronic.  She may have developed a muscular imbalance which predisposed her to injury.  She has an asymmetry in hip abductor/hip adductor strength.  Very mild posterior innominate rotation found today in standing.  She was given an  HEP and will continue flat surface running for short distances.  Encouraged ice after exercise and very gentle stretching.    Clinical Presentation Stable   Clinical Decision Making Low   Rehab  Potential Excellent   PT Frequency 2x / week  1-2 times per week    PT Duration 6 weeks   PT Treatment/Interventions ADLs/Self Care Home Management;Iontophoresis 4mg /ml Dexamethasone;Neuromuscular re-education;Dry needling;Manual techniques;Functional mobility training;Moist Heat;Ultrasound;Cryotherapy;Electrical Stimulation;Therapeutic exercise;Therapeutic activities;Patient/family education;Passive range of motion;Taping;Other (comment)  Pilates   PT Next Visit Plan Dry needling, check HEp, manual ,  Try Reformer/tower   PT Home Exercise Plan gentle hamstring stretch, hip abd/add SLR and clam    Consulted and Agree with Plan of Care Patient      Patient will benefit from skilled therapeutic intervention in order to improve the following deficits and impairments:     Visit Diagnosis: Pain in right leg  Muscle weakness (generalized)     Problem List Patient Active Problem List   Diagnosis Date Noted  . Acid reflux 08/16/2017  . Urinary incontinence 12/21/2016  . Preventative health care 12/21/2016  . Abdominal pain 12/21/2016  . Vitamin D deficiency 12/21/2016  . Nasal vestibulitis 05/01/2015  . H/O atopic dermatitis 03/17/2015  . External hemorrhoids 02/01/2014    PAA,JENNIFER 08/23/2017, 3:50 PM  [facility] [address] [place], [place], [address] Phone: [phone]   Fax:  [phone]  Name: Valerie Jones MRN: [id number] Date of Birth: May 18, 1971

## 2017-08-26 DIAGNOSIS — R079 Chest pain, unspecified: Secondary | ICD-10-CM | POA: Diagnosis not present

## 2017-08-26 DIAGNOSIS — K219 Gastro-esophageal reflux disease without esophagitis: Secondary | ICD-10-CM | POA: Diagnosis not present

## 2017-08-26 DIAGNOSIS — R194 Change in bowel habit: Secondary | ICD-10-CM | POA: Diagnosis not present

## 2017-09-06 ENCOUNTER — Encounter: Payer: Self-pay | Admitting: Physical Therapy

## 2017-09-06 ENCOUNTER — Ambulatory Visit: Payer: 59 | Admitting: Physical Therapy

## 2017-09-06 DIAGNOSIS — M6281 Muscle weakness (generalized): Secondary | ICD-10-CM | POA: Diagnosis not present

## 2017-09-06 DIAGNOSIS — M79604 Pain in right leg: Secondary | ICD-10-CM | POA: Diagnosis not present

## 2017-09-06 NOTE — Therapy (Signed)
[place], [place], [address] Phone: [phone]   Fax:  [phone]  Physical Therapy Treatment  Patient Details  Name: Valerie Jones MRN: [id number] Date of Birth: 1971-02-12 Referring Provider: Dr. Micheline Chapman  Encounter Date: 09/06/2017      PT End of Session - 09/06/17 0759    Visit Number 2   Number of Visits 8   Date for PT Re-Evaluation 10/18/17   PT Start Time 0800   PT Stop Time 0844   PT Time Calculation (min) 44 min   Activity Tolerance Patient tolerated treatment well   Behavior During Therapy [facility] for tasks assessed/performed      Past Medical History:  Diagnosis Date  . Abdominal pain 12/21/2016  . Acid reflux 08/16/2017  . ASCUS of cervix with negative high risk HPV 03/2017  . GERD (gastroesophageal reflux disease)   . H/O atopic dermatitis 03/17/2015  . Hemorrhoids   . History of chicken pox   . Preventative health care 12/21/2016  . Urinary incontinence 12/21/2016  . Vitamin D deficiency 12/21/2016    Past Surgical History:  Procedure Laterality Date  . APPENDECTOMY  1988  . INTRAUTERINE DEVICE INSERTION  11/13/2016   MIRENA    There were no vitals filed for this visit.      Subjective Assessment - 09/06/17 0759    Subjective Has been religious about doing exercises/stretches. Notices a huge improvement in strength and flexibility. Pain is still about the same. Ran 2-3 days ago about 3 miles, feels that the pain was a little bit lower.    Patient Stated Goals Avoid injury and return to normal running, has considered 1/2 marathon    Currently in Pain? Yes   Pain Score 2   5/10 after run   Pain Location Leg   Pain Orientation Right;Posterior   Aggravating Factors  running   Pain Relieving Factors rest                         [facility] Adult PT Treatment/Exercise - 09/06/17 0001      Exercises   Exercises Knee/Hip     Knee/Hip Exercises: Stretches   Active  Hamstring Stretch Right;5 reps   Hip Flexor Stretch Limitations thomas test position     Knee/Hip Exercises: Aerobic   Stationary Bike 5 min L3     Knee/Hip Exercises: Supine   Other Supine Knee/Hip Exercises SLR with opp arm reach     Knee/Hip Exercises: Prone   Hip Extension 20 reps;Right          Trigger Point Dry Needling - 09/06/17 0830    Consent Given? Yes   Education Handout Provided --  verbal   Muscles Treated Lower Body Hamstring   Hamstring Response Twitch response elicited;Palpable increased muscle length              PT Education - 09/06/17 0949    Education provided Yes   Education Details TPDN & expected outcomes, running mechanics, sensation changes  as hamstrings relax- effects on biomechanical chain    Person(s) Educated Patient   Methods Explanation;Demonstration;Tactile cues;Verbal cues;Handout   Comprehension Verbalized understanding;Returned demonstration;Verbal cues required;Tactile cues required;Need further instruction             PT Long Term Goals - 08/23/17 1542      PT LONG TERM GOAL #1   Title Pt will be I with HEP    Time 6   Period  Weeks   Status New   Target Date 10/04/17     PT LONG TERM GOAL #2   Title Pt will be able to report no discomfort during the work day, even with climbing stairs    Time 6   Period Weeks   Status New   Target Date 10/04/17     PT LONG TERM GOAL #3   Title Pt will be able to demo increased hamstring length 5 deg each leg    Time 6   Period Weeks   Status New   Target Date 10/04/17     PT LONG TERM GOAL #4   Title Pt will be able to run 2 miles with no increase in knee pain, hamstring pain in order to prepare for longer distances.    Time 6   Period Weeks   Status New   Target Date 10/04/17               Plan - 09/06/17 0941    Clinical Impression Statement TPDN to R semitendinosus with twitch response elicited. Pt reported feeling better when she stood following. notable  lack of trunk rotation and R hip extension in running pattern.    PT Treatment/Interventions ADLs/Self Care Home Management;Iontophoresis 4mg /ml Dexamethasone;Neuromuscular re-education;Dry needling;Manual techniques;Functional mobility training;Moist Heat;Ultrasound;Cryotherapy;Electrical Stimulation;Therapeutic exercise;Therapeutic activities;Patient/family education;Passive range of motion;Taping;Other (comment)   PT Next Visit Plan Dry needling response?,Try Reformer/tower   PT Home Exercise Plan gentle hamstring stretch, hip abd/add SLR and clam; thomas test illiopsoas stretch   Consulted and Agree with Plan of Care Patient      Patient will benefit from skilled therapeutic intervention in order to improve the following deficits and impairments:  Decreased activity tolerance, Pain, Improper body mechanics, Decreased strength, Increased muscle spasms  Visit Diagnosis: Pain in right leg  Muscle weakness (generalized)     Problem List Patient Active Problem List   Diagnosis Date Noted  . Acid reflux 08/16/2017  . Urinary incontinence 12/21/2016  . Preventative health care 12/21/2016  . Abdominal pain 12/21/2016  . Vitamin D deficiency 12/21/2016  . Nasal vestibulitis 05/01/2015  . H/O atopic dermatitis 03/17/2015  . External hemorrhoids 02/01/2014    Blane Worthington C. Deseri Loss PT, DPT 09/06/17 9:51 AM   [facility] [address] [place], [place], [address] Phone: [phone]   Fax:  [phone]  Name: Valerie Jones MRN: [id number] Date of Birth: Apr 24, 1971

## 2017-09-08 MED FILL — OMEPRAZOLE DR 40 MG CAPSULE: 40 | 90 days supply | Qty: 90 | Fill #0

## 2017-09-09 ENCOUNTER — Ambulatory Visit: Payer: 59 | Admitting: Physical Therapy

## 2017-09-13 ENCOUNTER — Encounter: Payer: Self-pay | Admitting: Physical Therapy

## 2017-09-13 ENCOUNTER — Ambulatory Visit: Payer: 59 | Attending: Sports Medicine | Admitting: Physical Therapy

## 2017-09-13 DIAGNOSIS — M6281 Muscle weakness (generalized): Secondary | ICD-10-CM | POA: Diagnosis not present

## 2017-09-13 DIAGNOSIS — M79604 Pain in right leg: Secondary | ICD-10-CM | POA: Diagnosis not present

## 2017-09-13 NOTE — Therapy (Signed)
[name], [place], [address] Phone: [phone]   Fax:  [phone]  Physical Therapy Treatment  Patient Details  Name: Valerie Jones MRN: [id number] Date of Birth: 11-21-1971 Referring Provider: Dr. Micheline Chapman  Encounter Date: 09/13/2017      PT End of Session - 09/13/17 0803    Visit Number 3   Number of Visits 8   Date for PT Re-Evaluation 10/18/17   PT Start Time 0803      Past Medical History:  Diagnosis Date  . Abdominal pain 12/21/2016  . Acid reflux 08/16/2017  . ASCUS of cervix with negative high risk HPV 03/2017  . GERD (gastroesophageal reflux disease)   . H/O atopic dermatitis 03/17/2015  . Hemorrhoids   . History of chicken pox   . Preventative health care 12/21/2016  . Urinary incontinence 12/21/2016  . Vitamin D deficiency 12/21/2016    Past Surgical History:  Procedure Laterality Date  . APPENDECTOMY  1988  . INTRAUTERINE DEVICE INSERTION  11/13/2016   MIRENA    There were no vitals filed for this visit.      Subjective Assessment - 09/13/17 0803    Subjective reports feeling about the same, sore for a couple of days after DN. had a busy week and did not feel like she was able to complete HEP as much as she would have liked.    Patient Stated Goals Avoid injury and return to normal running, has considered 1/2 marathon    Currently in Pain? Yes   Pain Score 2    Pain Location Leg   Pain Orientation Right;Posterior                         [facility] Adult PT Treatment/Exercise - 09/13/17 0001      Knee/Hip Exercises: Aerobic   Stationary Bike 5 min L3       Pilates Reformer used for LE/core strength, postural strength, lumbopelvic disassociation and core control.  Exercises included: Footwork- 2R1B neutral, ER on heels, on toes Bridging- 2R1B ball bw knees  Feet in Straps- 1R1B ext rot heels together press, straight leg lower & circles in LE  ER                PT Long Term Goals - 08/23/17 1542      PT LONG TERM GOAL #1   Title Pt will be I with HEP    Time 6   Period Weeks   Status New   Target Date 10/04/17     PT LONG TERM GOAL #2   Title Pt will be able to report no discomfort during the work day, even with climbing stairs    Time 6   Period Weeks   Status New   Target Date 10/04/17     PT LONG TERM GOAL #3   Title Pt will be able to demo increased hamstring length 5 deg each leg    Time 6   Period Weeks   Status New   Target Date 10/04/17     PT LONG TERM GOAL #4   Title Pt will be able to run 2 miles with no increase in knee pain, hamstring pain in order to prepare for longer distances.    Time 6   Period Weeks   Status New   Target Date 10/04/17             Patient will benefit from skilled therapeutic  intervention in order to improve the following deficits and impairments:     Visit Diagnosis: No diagnosis found.     Problem List Patient Active Problem List   Diagnosis Date Noted  . Acid reflux 08/16/2017  . Urinary incontinence 12/21/2016  . Preventative health care 12/21/2016  . Abdominal pain 12/21/2016  . Vitamin D deficiency 12/21/2016  . Nasal vestibulitis 05/01/2015  . H/O atopic dermatitis 03/17/2015  . External hemorrhoids 02/01/2014    Leeroy Lovings C. Chereese Cilento PT, DPT 09/13/17 1:03 PM   [place] [facility] [address] [place], [place], [address] Phone: [phone]   Fax:  [phone]  Name: KIYOKO MCGUIRT MRN: [id number] Date of Birth: [birth year]

## 2017-09-20 ENCOUNTER — Ambulatory Visit: Payer: 59 | Admitting: Physical Therapy

## 2017-09-20 ENCOUNTER — Encounter: Payer: Self-pay | Admitting: Physical Therapy

## 2017-09-20 DIAGNOSIS — M6281 Muscle weakness (generalized): Secondary | ICD-10-CM | POA: Diagnosis not present

## 2017-09-20 DIAGNOSIS — M79604 Pain in right leg: Secondary | ICD-10-CM

## 2017-09-20 NOTE — Therapy (Signed)
[place], [place], [address] Phone: [phone]   Fax:  [phone]  Physical Therapy Treatment  Patient Details  Name: Valerie Jones MRN: [id number] Date of Birth: Feb 12, 1971 Referring Provider: Dr. Micheline Chapman  Encounter Date: 09/20/2017      PT End of Session - 09/20/17 0800    Visit Number 4   Number of Visits 8   Date for PT Re-Evaluation 10/18/17   PT Start Time 0800   PT Stop Time 0838   PT Time Calculation (min) 38 min   Activity Tolerance Patient tolerated treatment well   Behavior During Therapy [facility] for tasks assessed/performed      Past Medical History:  Diagnosis Date  . Abdominal pain 12/21/2016  . Acid reflux 08/16/2017  . ASCUS of cervix with negative high risk HPV 03/2017  . GERD (gastroesophageal reflux disease)   . H/O atopic dermatitis 03/17/2015  . Hemorrhoids   . History of chicken pox   . Preventative health care 12/21/2016  . Urinary incontinence 12/21/2016  . Vitamin D deficiency 12/21/2016    Past Surgical History:  Procedure Laterality Date  . APPENDECTOMY  1988  . INTRAUTERINE DEVICE INSERTION  11/13/2016   MIRENA    There were no vitals filed for this visit.      Subjective Assessment - 09/20/17 0800    Subjective Feels about the same at this point. strength and flexibility are improved but still has pain.    Patient Stated Goals Avoid injury and return to normal running, has considered 1/2 marathon                          [facility] Adult PT Treatment/Exercise - 09/20/17 0001      Exercises   Exercises Other Exercises   Other Exercises  reformer, see note     Knee/Hip Exercises: Stretches   Passive Hamstring Stretch Limitations seated EOB     Modalities   Modalities Ultrasound     Ultrasound   Ultrasound Location R hamstring   Ultrasound Parameters 1.0 w/cm2 cont 8 min   Ultrasound Goals Pain     Manual Therapy   Manual therapy comments  skilled palpation & monitoring while performing TPDN          Trigger Point Dry Needling - 09/20/17 0811    Muscles Treated Lower Body Hamstring  R   Hamstring Response Twitch response elicited;Palpable increased muscle length      Pilates Reformer used for LE/core strength, postural strength, lumbopelvic disassociation and core control.  Exercises included: Footwork- 2R1B, neutral, ER, alt marching Feet in Straps- 2R, ER press, neutral straight leg press, circles           PT Education - 09/20/17 0840    Education provided Yes   Education Details exercise form/rationale, rationale for [place], try to avoid stairs today-focus on stretching   Person(s) Educated Patient   Methods Explanation;Demonstration;Tactile cues;Verbal cues   Comprehension Verbalized understanding;Returned demonstration;Verbal cues required;Tactile cues required;Need further instruction             PT Long Term Goals - 08/23/17 1542      PT LONG TERM GOAL #1   Title Pt will be I with HEP    Time 6   Period Weeks   Status New   Target Date 10/04/17     PT LONG TERM GOAL #2   Title Pt will be able to report no discomfort during  the work day, even with climbing stairs    Time 6   Period Weeks   Status New   Target Date 10/04/17     PT LONG TERM GOAL #3   Title Pt will be able to demo increased hamstring length 5 deg each leg    Time 6   Period Weeks   Status New   Target Date 10/04/17     PT LONG TERM GOAL #4   Title Pt will be able to run 2 miles with no increase in knee pain, hamstring pain in order to prepare for longer distances.    Time 6   Period Weeks   Status New   Target Date 10/04/17               Plan - 09/20/17 0840    Clinical Impression Statement Reformer at beginning of treatment was irritatble to hamstring but reported it was very mild. Twitch in semimembranosus recreated concordant pain, DN followed by [place] for deep heat and healing of muscle fibers. Pt reported  "it feels really good" following treatment today.    PT Treatment/Interventions ADLs/Self Care Home Management;Iontophoresis 4mg /ml Dexamethasone;Neuromuscular re-education;Dry needling;Manual techniques;Functional mobility training;Moist Heat;Ultrasound;Cryotherapy;Electrical Stimulation;Therapeutic exercise;Therapeutic activities;Patient/family education;Passive range of motion;Taping;Other (comment)   PT Next Visit Plan carryover from DN/US? trunk rotation in running mechanics   PT Home Exercise Plan gentle hamstring stretch, hip abd/add SLR and clam; thomas test illiopsoas stretch; single leg squat   Consulted and Agree with Plan of Care Patient      Patient will benefit from skilled therapeutic intervention in order to improve the following deficits and impairments:  Decreased activity tolerance, Pain, Improper body mechanics, Decreased strength, Increased muscle spasms  Visit Diagnosis: Pain in right leg  Muscle weakness (generalized)     Problem List Patient Active Problem List   Diagnosis Date Noted  . Acid reflux 08/16/2017  . Urinary incontinence 12/21/2016  . Preventative health care 12/21/2016  . Abdominal pain 12/21/2016  . Vitamin D deficiency 12/21/2016  . Nasal vestibulitis 05/01/2015  . H/O atopic dermatitis 03/17/2015  . External hemorrhoids 02/01/2014   Anyae Griffith C. Mallie Linnemann PT, DPT 09/20/17 8:44 AM   [place] [facility] [address] [place], [place], [address] Phone: [phone]   Fax:  [phone]  Name: Valerie Jones MRN: [id number] Date of Birth: 1970/12/27

## 2017-09-27 ENCOUNTER — Ambulatory Visit: Payer: 59 | Admitting: Physical Therapy

## 2017-09-27 DIAGNOSIS — M79604 Pain in right leg: Secondary | ICD-10-CM

## 2017-09-27 DIAGNOSIS — M6281 Muscle weakness (generalized): Secondary | ICD-10-CM | POA: Diagnosis not present

## 2017-09-27 NOTE — Therapy (Signed)
[place], [place], [address] Phone: [phone]   Fax:  [phone]  Physical Therapy Treatment  Patient Details  Name: Valerie Jones MRN: [id number] Date of Birth: [birth year] Referring Provider: Dr. Micheline Chapman  Encounter Date: 09/27/2017      PT End of Session - 09/27/17 0814    Visit Number 5   Number of Visits 8   Date for PT Re-Evaluation 10/18/17   PT Start Time 0801   PT Stop Time 6659   PT Time Calculation (min) 46 min   Activity Tolerance Patient tolerated treatment well   Behavior During Therapy [facility] for tasks assessed/performed      Past Medical History:  Diagnosis Date  . Abdominal pain 12/21/2016  . Acid reflux 08/16/2017  . ASCUS of cervix with negative high risk HPV 03/2017  . GERD (gastroesophageal reflux disease)   . H/O atopic dermatitis 03/17/2015  . Hemorrhoids   . History of chicken pox   . Preventative health care 12/21/2016  . Urinary incontinence 12/21/2016  . Vitamin D deficiency 12/21/2016    Past Surgical History:  Procedure Laterality Date  . APPENDECTOMY  1988  . INTRAUTERINE DEVICE INSERTION  11/13/2016   MIRENA    There were no vitals filed for this visit.      Subjective Assessment - 09/27/17 0805    Subjective I think I need to shift my workouts.  Pt asking about barre and Pilates classes. Havent had pain since I haven't done much since last week.  I wonder if the DN is a mental thing.    Currently in Pain? No/denies            [facility] PT Assessment - 09/27/17 0001      Strength   Right Hip ABduction 4+/5   Left Hip ABduction 5/5     Flexibility   Hamstrings Rt. 30 , L 30 in 90/90 deg.               [place] Adult PT Treatment/Exercise - 09/27/17 0001      Knee/Hip Exercises: Aerobic   Stationary Bike 6 min L3        Pilates Tower for LE/Core strength, postural strength, lumbopelvic disassociation and core control.  Exercises included:  Supine  Leg [place]  Yellow: Arcs single and double leg  Squats, circles and scissors   Sidelying Leg [place] yellow   Hip abd/add x 10 each   Hip flexion/ext x 10  Knee and hip flexion  X 10  Piriformis stretching anf hamstring/ITB stretching bilaterally Min cues for core control       PT Long Term Goals - 09/27/17 0837      PT LONG TERM GOAL #1   Title Pt will be I with HEP    Baseline up to date    Status On-going     PT LONG TERM GOAL #2   Title Pt will be able to report no discomfort during the work day, even with climbing stairs    Baseline improved but has not done much    Status On-going     PT LONG TERM GOAL #3   Title Pt will be able to demo increased hamstring length 5 deg each leg    Status On-going     PT LONG TERM GOAL #4   Title Pt will be able to run 2 miles with no increase in knee pain, hamstring pain in order to prepare for longer distances.  Status On-going               Plan - 09/27/17 7017    Clinical Impression Statement Patient worked on pelvic stability, dynamic hamstring stretching and activation.  Pain improved but she has been more sedentary than usual.  Hamstring length not improved with measurement but her hip abd strength is.  She sees MD tomorrow and then 2 more PT visits.  She plans to attend a [employer] class for ongoing fitness.    PT Next Visit Plan Pilates and possibly repeat DN, finalize HEP   PT Home Exercise Plan gentle hamstring stretch, hip abd/add SLR and clam; thomas test illiopsoas stretch; single leg squat   Consulted and Agree with Plan of Care Patient      Patient will benefit from skilled therapeutic intervention in order to improve the following deficits and impairments:  Decreased activity tolerance, Pain, Improper body mechanics, Decreased strength, Increased muscle spasms  Visit Diagnosis: Pain in right leg  Muscle weakness (generalized)     Problem List Patient Active Problem List   Diagnosis Date  Noted  . Acid reflux 08/16/2017  . Urinary incontinence 12/21/2016  . Preventative health care 12/21/2016  . Abdominal pain 12/21/2016  . Vitamin D deficiency 12/21/2016  . Nasal vestibulitis 05/01/2015  . H/O atopic dermatitis 03/17/2015  . External hemorrhoids 02/01/2014    Birdella Sippel 09/27/2017, 1:08 PM  [facility] [address] [place], [place], [address] Phone: [phone]   Fax:  [phone]  Name: Valerie Jones MRN: [id number] Date of Birth: [birth year]  Raeford Razor, PT 09/27/17 1:12 PM Phone: [phone] Fax: [phone]

## 2017-09-28 ENCOUNTER — Ambulatory Visit: Payer: 59 | Admitting: Sports Medicine

## 2017-10-04 ENCOUNTER — Ambulatory Visit: Payer: 59 | Admitting: Physical Therapy

## 2017-10-04 ENCOUNTER — Encounter: Payer: Self-pay | Admitting: Physical Therapy

## 2017-10-04 DIAGNOSIS — M79604 Pain in right leg: Secondary | ICD-10-CM | POA: Diagnosis not present

## 2017-10-04 DIAGNOSIS — M6281 Muscle weakness (generalized): Secondary | ICD-10-CM | POA: Diagnosis not present

## 2017-10-04 NOTE — Therapy (Signed)
[place], [place], [address] Phone: [phone]   Fax:  [phone]  Physical Therapy Treatment  Patient Details  Name: Valerie Jones MRN: [id number] Date of Birth: 1971/02/24 Referring Provider: Dr. Micheline Chapman  Encounter Date: 10/04/2017      PT End of Session - 10/04/17 0803    Visit Number 6   Number of Visits 8   Date for PT Re-Evaluation 10/18/17   PT Start Time 0803   PT Stop Time 6384   PT Time Calculation (min) 44 min   Activity Tolerance Patient tolerated treatment well   Behavior During Therapy [facility] for tasks assessed/performed      Past Medical History:  Diagnosis Date  . Abdominal pain 12/21/2016  . Acid reflux 08/16/2017  . ASCUS of cervix with negative high risk HPV 03/2017  . GERD (gastroesophageal reflux disease)   . H/O atopic dermatitis 03/17/2015  . Hemorrhoids   . History of chicken pox   . Preventative health care 12/21/2016  . Urinary incontinence 12/21/2016  . Vitamin D deficiency 12/21/2016    Past Surgical History:  Procedure Laterality Date  . APPENDECTOMY  1988  . INTRAUTERINE DEVICE INSERTION  11/13/2016   MIRENA    There were no vitals filed for this visit.      Subjective Assessment - 10/04/17 0803    Subjective continues to feel about the same.    Patient Stated Goals Avoid injury and return to normal running, has considered 1/2 marathon    Currently in Pain? No/denies                         [facility] Adult PT Treatment/Exercise - 10/04/17 0001      Knee/Hip Exercises: Aerobic   Stationary Bike 5 min L5     Knee/Hip Exercises: Standing   Functional Squat 2 sets;10 reps   Functional Squat Limitations red tband at knees   Other Standing Knee Exercises single leg squat red tband     Knee/Hip Exercises: Supine   Other Supine Knee/Hip Exercises hooklying Rleg squeeze in on ball     Knee/Hip Exercises: Sidelying   Hip ABduction 2 sets;10 reps   Hip ABduction Limitations abduction-kick   Hip ADduction Limitations RLE ADD with IR turn     Knee/Hip Exercises: Prone   Straight Leg Raises Limitations primal push up hip et     Manual Therapy   Manual Therapy Joint mobilization   Joint Mobilization gr 5 distraction, AP talocrural mobs; CKC DF mobs with squatting       Pilates Reformer used for LE/core strength, postural strength, lumbopelvic disassociation and core control.  Exercises included: Footwork -2R1B Jumping-1R1B feet switches             PT Long Term Goals - 09/27/17 0837      PT LONG TERM GOAL #1   Title Pt will be I with HEP    Baseline up to date    Status On-going     PT LONG TERM GOAL #2   Title Pt will be able to report no discomfort during the work day, even with climbing stairs    Baseline improved but has not done much    Status On-going     PT LONG TERM GOAL #3   Title Pt will be able to demo increased hamstring length 5 deg each leg    Status On-going     PT LONG TERM GOAL #4  Title Pt will be able to run 2 miles with no increase in knee pain, hamstring pain in order to prepare for longer distances.    Status On-going               Plan - 10/04/17 0849    Clinical Impression Statement Pt with apparent R innominate outflare, activated adductors for balance. Also noted decreased DF in R ankle addressed with manual mobilization & manipulation today. Will reassess at next visit if these treatments had lasting effects.    PT Treatment/Interventions ADLs/Self Care Home Management;Iontophoresis 4mg /ml Dexamethasone;Neuromuscular re-education;Dry needling;Manual techniques;Functional mobility training;Moist Heat;Ultrasound;Cryotherapy;Electrical Stimulation;Therapeutic exercise;Therapeutic activities;Patient/family education;Passive range of motion;Taping;Other (comment)   PT Next Visit Plan Pilates and possibly repeat DN, finalize HEP   PT Home Exercise Plan gentle hamstring stretch, hip  abd/add SLR and clam; thomas test illiopsoas stretch; single leg squat; primal push up hip ext, hooklying ball squeeze   Consulted and Agree with Plan of Care Patient      Patient will benefit from skilled therapeutic intervention in order to improve the following deficits and impairments:  Decreased activity tolerance, Pain, Improper body mechanics, Decreased strength, Increased muscle spasms  Visit Diagnosis: Muscle weakness (generalized)  Pain in right leg     Problem List Patient Active Problem List   Diagnosis Date Noted  . Acid reflux 08/16/2017  . Urinary incontinence 12/21/2016  . Preventative health care 12/21/2016  . Abdominal pain 12/21/2016  . Vitamin D deficiency 12/21/2016  . Nasal vestibulitis 05/01/2015  . H/O atopic dermatitis 03/17/2015  . External hemorrhoids 02/01/2014   Carmin Alvidrez C. Guhan Bruington PT, DPT 10/04/17 9:20 AM   [facility] [address] [place], [place], [address] Phone: [phone]   Fax:  [phone]  Name: SHATYRA BECKA MRN: [id number] Date of Birth: 1971-07-01

## 2017-10-11 ENCOUNTER — Encounter: Payer: Self-pay | Admitting: Physical Therapy

## 2017-10-11 ENCOUNTER — Ambulatory Visit: Payer: 59 | Admitting: Physical Therapy

## 2017-10-11 DIAGNOSIS — M6281 Muscle weakness (generalized): Secondary | ICD-10-CM | POA: Diagnosis not present

## 2017-10-11 DIAGNOSIS — M79604 Pain in right leg: Secondary | ICD-10-CM | POA: Diagnosis not present

## 2017-10-11 NOTE — Therapy (Signed)
[place], [place], [address] Phone: [phone]   Fax:  [phone]  Physical Therapy Treatment and Discharge  Patient Details  Name: Valerie Jones MRN: [id number] Date of Birth: 27-Jun-1971 Referring Provider: Dr. Micheline Chapman  Encounter Date: 10/11/2017      PT End of Session - 10/11/17 0842    Visit Number 7   Number of Visits 8   Date for PT Re-Evaluation 10/18/17   PT Start Time 0805   PT Stop Time 1601   PT Time Calculation (min) 39 min   Activity Tolerance Patient tolerated treatment well   Behavior During Therapy [facility] for tasks assessed/performed      Past Medical History:  Diagnosis Date  . Abdominal pain 12/21/2016  . Acid reflux 08/16/2017  . ASCUS of cervix with negative high risk HPV 03/2017  . GERD (gastroesophageal reflux disease)   . H/O atopic dermatitis 03/17/2015  . Hemorrhoids   . History of chicken pox   . Preventative health care 12/21/2016  . Urinary incontinence 12/21/2016  . Vitamin D deficiency 12/21/2016    Past Surgical History:  Procedure Laterality Date  . APPENDECTOMY  1988  . INTRAUTERINE DEVICE INSERTION  11/13/2016   MIRENA    There were no vitals filed for this visit.      Subjective Assessment - 10/11/17 0806    Subjective Patient had a private session with Pilates.  Pain increases with squatting, bending with gardening, helping my parents move. Was sore from Pilates but no pain in hamstring.     Currently in Pain? No/denies            [facility] PT Assessment - 10/11/17 0001      Strength   Right Hip ABduction 5/5   Right Hip ADduction 4+/5   Left Hip ABduction 5/5   Left Hip ADduction 4+/5            [facility] Adult PT Treatment/Exercise - 10/11/17 0001      Self-Care   Self-Care Other Self-Care Comments   Other Self-Care Comments  wrap up, DC and HEP correction, suggestions      Lumbar Exercises: Quadruped   Plank primal push up with leg ext 1 x 10  each leg      Knee/Hip Exercises: Stretches   Active Hamstring Stretch Both;3 reps   Hip Flexor Stretch Both;2 reps;30 seconds   ITB Stretch Right;2 reps     Knee/Hip Exercises: Aerobic   Stationary Bike 6 min L5     Knee/Hip Exercises: Supine   Bridges Limitations x 10    Single Leg Bridge Strengthening;Both;1 set;10 reps  2 sets on Rt. LE      Knee/Hip Exercises: Sidelying   Hip ABduction Strengthening;Both;1 set                PT Education - 10/11/17 0840    Education provided Yes   Education Details HEP, progress, glute strength and symmetry   Person(s) Educated Patient   Methods Explanation   Comprehension Verbalized understanding             PT Long Term Goals - 10/11/17 0904      PT LONG TERM GOAL #1   Title Pt will be I with HEP    Status Achieved     PT LONG TERM GOAL #2   Title Pt will be able to report no discomfort during the work day, even with climbing stairs    Status Achieved  PT LONG TERM GOAL #3   Title Pt will be able to demo increased hamstring length 5 deg each leg    Status Partially Met     PT LONG TERM GOAL #4   Title Pt will be able to run 2 miles with no increase in knee pain, hamstring pain in order to prepare for longer distances.    Status Partially Met               Plan - 10/11/17 1255    Clinical Impression Statement Alynn has done well in PT, she has an HEP for flexibility and strength.  Her hips are now symmetrical in strength.  She plans to continue with community based PIlates.  Ready for DC.    PT Next Visit Plan DC   PT Home Exercise Plan gentle hamstring stretch, hip abd/add SLR and clam; thomas test illiopsoas stretch; single leg squat; primal push up hip ext, hooklying ball squeeze   Consulted and Agree with Plan of Care Patient      Patient will benefit from skilled therapeutic intervention in order to improve the following deficits and impairments:     Visit Diagnosis: Muscle weakness  (generalized)  Pain in right leg     Problem List Patient Active Problem List   Diagnosis Date Noted  . Acid reflux 08/16/2017  . Urinary incontinence 12/21/2016  . Preventative health care 12/21/2016  . Abdominal pain 12/21/2016  . Vitamin D deficiency 12/21/2016  . Nasal vestibulitis 05/01/2015  . H/O atopic dermatitis 03/17/2015  . External hemorrhoids 02/01/2014    PAA,JENNIFER 10/11/2017, 12:59 PM  [facility] [address] [place], [place], [address] Phone: [phone]   Fax:  [phone]  Name: RAVYN NIKKEL MRN: [id number] Date of Birth: 1971-05-30  Raeford Razor, PT 10/11/17 1:00 PM Phone: [phone] Fax: [phone]

## 2017-10-21 ENCOUNTER — Telehealth: Payer: Self-pay | Admitting: Family Medicine

## 2017-10-21 NOTE — Telephone Encounter (Signed)
The Probiotic is an over the counter medication she can pick up at any pharmacy

## 2017-10-21 NOTE — Telephone Encounter (Signed)
Self.    Pt is requesting a Rx for the probiotic, pt says that it is the NOW brand and its 1 a day. Pt would like to have a 3 month supply.    Pharmacy: [place]

## 2017-12-10 MED FILL — OMEPRAZOLE DR 40 MG CAPSULE: 40 | 90 days supply | Qty: 90 | Fill #1

## 2017-12-28 ENCOUNTER — Encounter: Payer: Self-pay | Admitting: Nurse Practitioner

## 2017-12-28 ENCOUNTER — Ambulatory Visit (INDEPENDENT_AMBULATORY_CARE_PROVIDER_SITE_OTHER): Payer: Self-pay | Admitting: Nurse Practitioner

## 2017-12-28 VITALS — BP 98/60 | HR 51 | Temp 98.4°F

## 2017-12-28 DIAGNOSIS — J209 Acute bronchitis, unspecified: Secondary | ICD-10-CM

## 2017-12-28 DIAGNOSIS — J069 Acute upper respiratory infection, unspecified: Secondary | ICD-10-CM

## 2017-12-28 MED ORDER — AZITHROMYCIN 250 MG PO TABS: ORAL_TABLET | ORAL | 0 refills | Status: AC

## 2017-12-28 MED ORDER — BENZONATATE 100 MG PO CAPS: 100.0000 mg | ORAL_CAPSULE | Freq: Three times a day (TID) | ORAL | 0 refills | Status: AC | PRN

## 2017-12-28 MED ORDER — HYDROCODONE-HOMATROPINE 5-1.5 MG/5ML PO SYRP: 5.0000 mL | ORAL_SOLUTION | Freq: Three times a day (TID) | ORAL | 0 refills | Status: AC | PRN

## 2017-12-28 MED ORDER — FLUTICASONE PROPIONATE 50 MCG/ACT NA SUSP: 2.0000 | Freq: Every day | NASAL | 0 refills | Status: DC

## 2017-12-28 NOTE — Progress Notes (Signed)
Subjective:  Valerie Jones is a 47 y.o. female who presents for evaluation of URI like symptoms.  Symptoms include congestion, cough described as productive, post nasal drip and sore throat.  Onset of symptoms was 7 days ago, and has been gradually improving since that time.  Treatment to date:  cough suppressants and decongestants.  High risk factors for influenza complications:  none.  Patient states URI symptoms are getting better but continues with the cough.  Patient does not smoke and   The following portions of the patient's history were reviewed and updated as appropriate:  allergies, current medications and past medical history.  Constitutional: positive for fevers, negative for anorexia, chills and sweats Eyes: negative Ears, nose, mouth, throat, and face: positive for nasal congestion and sore throat, negative for ear drainage, earaches and hoarseness Respiratory: positive for cough, negative for asthma, dyspnea on exertion and wheezing Cardiovascular: negative Neurological: positive for headaches Behavioral/Psych: negative Allergic/Immunologic: negative Objective:  BP 98/60   Pulse (!) 51   Temp 98.4 F (36.9 C)   SpO2 98%  General appearance: alert, cooperative and fatigued Head: Normocephalic, without obvious abnormality, atraumatic Eyes: conjunctivae/corneas clear. PERRL, EOM's intact. Fundi benign. Ears: abnormal TM right ear - mucoid middle ear fluid and abnormal TM left ear - mucoid middle ear fluid Nose: no discharge, moderate congestion, turbinates swollen, inflamed, no sinus tenderness Throat: lips, mucosa, and tongue normal; teeth and gums normal Lungs: clear to auscultation bilaterally Heart: regular rate and rhythm, S1, S2 normal, no murmur, click, rub or gallop Pulses: 2+ and symmetric Skin: Skin color, texture, turgor normal. No rashes or lesions Lymph nodes: cervical lymphadenopathy bilaterally Neurologic: Grossly normal    Assessment:  Acute Bronchitis  and Acute Upper Respiratory Infection    Plan:  Discussed diagnosis and treatment of URI. Discussed the importance of avoiding unnecessary antibiotic therapy. Educational material distributed and questions answered. Suggested symptomatic OTC remedies. Supportive care with appropriate antipyretics and fluids. Zithromax per orders. Nasal steroids per orders. Follow up as needed. Patient will start z-pak if symptoms do not improve in 3 days.  Fluticasone for nasal symptoms.  Hycodan syrup for severe cough and Tessalon Perles for cough.  Patient will follow up as needed.

## 2017-12-28 NOTE — Patient Instructions (Addendum)
Upper Respiratory Infection, Adult Most upper respiratory infections (URIs) are a viral infection of the air passages leading to the lungs. A URI affects the nose, throat, and upper air passages. The most common type of URI is nasopharyngitis and is typically referred to as "the common cold." URIs run their course and usually go away on their own. Most of the time, a URI does not require medical attention, but sometimes a bacterial infection in the upper airways can follow a viral infection. This is called a secondary infection. Sinus and middle ear infections are common types of secondary upper respiratory infections. Bacterial pneumonia can also complicate a URI. A URI can worsen asthma and chronic obstructive pulmonary disease (COPD). Sometimes, these complications can require emergency medical care and may be life threatening. What are the causes? Almost all URIs are caused by viruses. A virus is a type of germ and can spread from one person to another. What increases the risk? You may be at risk for a URI if:  You smoke.  You have chronic heart or lung disease.  You have a weakened defense (immune) system.  You are very young or very old.  You have nasal allergies or asthma.  You work in crowded or poorly ventilated areas.  You work in health care facilities or schools.  What are the signs or symptoms? Symptoms typically develop 2-3 days after you come in contact with a cold virus. Most viral URIs last 7-10 days. However, viral URIs from the influenza virus (flu virus) can last 14-18 days and are typically more severe. Symptoms may include:  Runny or stuffy (congested) nose.  Sneezing.  Cough.  Sore throat.  Headache.  Fatigue.  Fever.  Loss of appetite.  Pain in your forehead, behind your eyes, and over your cheekbones (sinus pain).  Muscle aches.  How is this diagnosed? Your health care provider may diagnose a URI by:  Physical exam.  Tests to check that your  symptoms are not due to another condition such as: ? Strep throat. ? Sinusitis. ? Pneumonia. ? Asthma.  How is this treated? A URI goes away on its own with time. It cannot be cured with medicines, but medicines may be prescribed or recommended to relieve symptoms. Medicines may help:  Reduce your fever.  Reduce your cough.  Relieve nasal congestion.  Follow these instructions at home:  Take medicines only as directed by your health care provider.  Gargle warm saltwater or take cough drops to comfort your throat as directed by your health care provider.  Use a warm mist humidifier or inhale steam from a shower to increase air moisture. This may make it easier to breathe.  Drink enough fluid to keep your urine clear or pale yellow.  Eat soups and other clear broths and maintain good nutrition.  Rest as needed.  Return to work when your temperature has returned to normal or as your health care provider advises. You may need to stay home longer to avoid infecting others. You can also use a face mask and careful hand washing to prevent spread of the virus.  Increase the usage of your inhaler if you have asthma.  Do not use any tobacco products, including cigarettes, chewing tobacco, or electronic cigarettes. If you need help quitting, ask your health care provider. How is this prevented? The best way to protect yourself from getting a cold is to practice good hygiene.  Avoid oral or hand contact with people with cold symptoms.  Wash your   hands often if contact occurs.  There is no clear evidence that vitamin C, vitamin E, echinacea, or exercise reduces the chance of developing a cold. However, it is always recommended to get plenty of rest, exercise, and practice good nutrition. Contact a health care provider if:  You are getting worse rather than better.  Your symptoms are not controlled by medicine.  You have chills.  You have worsening shortness of breath.  You have  brown or red mucus.  You have yellow or brown nasal discharge.  You have pain in your face, especially when you bend forward.  You have a fever.  You have swollen neck glands.  You have pain while swallowing.  You have white areas in the back of your throat. Get help right away if:  You have severe or persistent: ? Headache. ? Ear pain. ? Sinus pain. ? Chest pain.  You have chronic lung disease and any of the following: ? Wheezing. ? Prolonged cough. ? Coughing up blood. ? A change in your usual mucus.  You have a stiff neck.  You have changes in your: ? Vision. ? Hearing. ? Thinking. ? Mood. This information is not intended to replace advice given to you by your health care provider. Make sure you discuss any questions you have with your health care provider. Document Released: 05/26/2001 Document Revised: 08/02/2016 Document Reviewed: 03/07/2014 Elsevier Interactive Patient Education  2018 [employer]  Cough, Adult Coughing is a reflex that clears your throat and your airways. Coughing helps to heal and protect your lungs. It is normal to cough occasionally, but a cough that happens with other symptoms or lasts a long time may be a sign of a condition that needs treatment. A cough may last only 2-3 weeks (acute), or it may last longer than 8 weeks (chronic). What are the causes? Coughing is commonly caused by:  Breathing in substances that irritate your lungs.  A viral or bacterial respiratory infection.  Allergies.  Asthma.  Postnasal drip.  Smoking.  Acid backing up from the stomach into the esophagus (gastroesophageal reflux).  Certain medicines.  Chronic lung problems, including COPD (or rarely, lung cancer).  Other medical conditions such as heart failure.  Follow these instructions at home: Pay attention to any changes in your symptoms. Take these actions to help with your discomfort:  Take medicines only as told by your health care  provider. ? If you were prescribed an antibiotic medicine, take it as told by your health care provider. Do not stop taking the antibiotic even if you start to feel better. ? Talk with your health care provider before you take a cough suppressant medicine.  Drink enough fluid to keep your urine clear or pale yellow.  If the air is dry, use a cold steam vaporizer or humidifier in your bedroom or your home to help loosen secretions.  Avoid anything that causes you to cough at work or at home.  If your cough is worse at night, try sleeping in a semi-upright position.  Avoid cigarette smoke. If you smoke, quit smoking. If you need help quitting, ask your health care provider.  Avoid caffeine.  Avoid alcohol.  Rest as needed.  Contact a health care provider if:  You have new symptoms.  You cough up pus.  Your cough does not get better after 2-3 weeks, or your cough gets worse.  You cannot control your cough with suppressant medicines and you are losing sleep.  You develop pain that is   getting worse or pain that is not controlled with pain medicines.  You have a fever.  You have unexplained weight loss.  You have night sweats. Get help right away if:  You cough up blood.  You have difficulty breathing.  Your heartbeat is very fast. This information is not intended to replace advice given to you by your health care provider. Make sure you discuss any questions you have with your health care provider. Document Released: 05/29/2011 Document Revised: 05/07/2016 Document Reviewed: 02/06/2015 Elsevier Interactive Patient Education  2018 [employer] Inc.  

## 2017-12-30 ENCOUNTER — Telehealth: Payer: Self-pay

## 2017-12-30 NOTE — Telephone Encounter (Signed)
Pt called back and states that she is fine.

## 2018-01-10 ENCOUNTER — Other Ambulatory Visit: Payer: Self-pay | Admitting: Gynecology

## 2018-01-10 ENCOUNTER — Telehealth: Payer: Self-pay

## 2018-01-10 DIAGNOSIS — Z139 Encounter for screening, unspecified: Secondary | ICD-10-CM

## 2018-01-10 DIAGNOSIS — Z0000 Encounter for general adult medical examination without abnormal findings: Secondary | ICD-10-CM

## 2018-01-10 DIAGNOSIS — E559 Vitamin D deficiency, unspecified: Secondary | ICD-10-CM

## 2018-01-10 NOTE — Telephone Encounter (Signed)
Other than basic 4 what other labs would you like to be ordered    Please advise

## 2018-01-10 NOTE — Telephone Encounter (Signed)
Plus vitamin D for vit D deficiency

## 2018-01-11 NOTE — Telephone Encounter (Signed)
Orders placed. Patient notified.

## 2018-01-24 ENCOUNTER — Other Ambulatory Visit: Payer: Self-pay | Admitting: Nurse Practitioner

## 2018-01-27 ENCOUNTER — Ambulatory Visit
Admission: RE | Admit: 2018-01-27 | Discharge: 2018-01-27 | Disposition: A | Discharge status: Home / Self Care | Payer: 59 | Source: Ambulatory Visit | Attending: Gynecology | Admitting: Gynecology

## 2018-01-27 DIAGNOSIS — Z139 Encounter for screening, unspecified: Secondary | ICD-10-CM

## 2018-01-27 DIAGNOSIS — Z1231 Encounter for screening mammogram for malignant neoplasm of breast: Secondary | ICD-10-CM | POA: Diagnosis not present

## 2018-03-14 ENCOUNTER — Other Ambulatory Visit (INDEPENDENT_AMBULATORY_CARE_PROVIDER_SITE_OTHER): Payer: 59

## 2018-03-14 DIAGNOSIS — E559 Vitamin D deficiency, unspecified: Secondary | ICD-10-CM | POA: Diagnosis not present

## 2018-03-14 DIAGNOSIS — Z0000 Encounter for general adult medical examination without abnormal findings: Secondary | ICD-10-CM | POA: Diagnosis not present

## 2018-03-14 LAB — VITAMIN D 25 HYDROXY (VIT D DEFICIENCY, FRACTURES): VITD: 31.51 ng/mL (ref 30.00–100.00)

## 2018-03-14 LAB — COMPREHENSIVE METABOLIC PANEL
ALBUMIN: 4.2 g/dL (ref 3.5–5.2)
ALK PHOS: 33 U/L — AB (ref 39–117)
ALT: 11 U/L (ref 0–35)
AST: 16 U/L (ref 0–37)
BUN: 17 mg/dL (ref 6–23)
CALCIUM: 9.4 mg/dL (ref 8.4–10.5)
CHLORIDE: 104 meq/L (ref 96–112)
CO2: 29 mEq/L (ref 19–32)
Creatinine, Ser: 0.81 mg/dL (ref 0.40–1.20)
GFR: 79.88 mL/min (ref >60.00)
Glucose, Bld: 104 mg/dL — ABNORMAL HIGH (ref 70–99)
POTASSIUM: 4.3 meq/L (ref 3.5–5.1)
SODIUM: 141 meq/L (ref 135–145)
TOTAL PROTEIN: 7 g/dL (ref 6.0–8.3)
Total Bilirubin: 0.4 mg/dL (ref 0.2–1.2)

## 2018-03-14 LAB — CBC
HEMATOCRIT: 40.1 % (ref 36.0–46.0)
HEMOGLOBIN: 13.4 g/dL (ref 12.0–15.0)
MCHC: 33.3 g/dL (ref 30.0–36.0)
MCV: 96.8 fl (ref 78.0–100.0)
PLATELETS: 260 10*3/uL (ref 150.0–400.0)
RBC: 4.14 Mil/uL (ref 3.87–5.11)
RDW: 13 % (ref 11.5–15.5)
WBC: 3.6 10*3/uL — AB (ref 4.0–10.5)

## 2018-03-14 LAB — LIPID PANEL
CHOLESTEROL: 178 mg/dL (ref 0–200)
HDL: 66.7 mg/dL (ref >39.00)
LDL CALC: 95 mg/dL (ref 0–99)
NonHDL: 111.21
Total CHOL/HDL Ratio: 3
Triglycerides: 81 mg/dL (ref 0.0–149.0)
VLDL: 16.2 mg/dL (ref 0.0–40.0)

## 2018-03-14 LAB — TSH: TSH: 2.42 u[IU]/mL (ref 0.35–4.50)

## 2018-03-15 MED FILL — OMEPRAZOLE DR 40 MG CAPSULE: 40 | 90 days supply | Qty: 90 | Fill #2

## 2018-03-24 ENCOUNTER — Ambulatory Visit (INDEPENDENT_AMBULATORY_CARE_PROVIDER_SITE_OTHER): Payer: 59 | Admitting: Family Medicine

## 2018-03-24 ENCOUNTER — Encounter: Payer: Self-pay | Admitting: Family Medicine

## 2018-03-24 VITALS — BP 102/80 | HR 50 | Temp 98.1°F | Resp 18 | Wt 133.4 lb

## 2018-03-24 DIAGNOSIS — Z0000 Encounter for general adult medical examination without abnormal findings: Secondary | ICD-10-CM | POA: Diagnosis not present

## 2018-03-24 DIAGNOSIS — E559 Vitamin D deficiency, unspecified: Secondary | ICD-10-CM

## 2018-03-24 DIAGNOSIS — K2100 Gastro-esophageal reflux disease with esophagitis, without bleeding: Secondary | ICD-10-CM

## 2018-03-24 DIAGNOSIS — R739 Hyperglycemia, unspecified: Secondary | ICD-10-CM

## 2018-03-24 DIAGNOSIS — D72819 Decreased white blood cell count, unspecified: Secondary | ICD-10-CM | POA: Insufficient documentation

## 2018-03-24 DIAGNOSIS — F419 Anxiety disorder, unspecified: Secondary | ICD-10-CM

## 2018-03-24 HISTORY — DX: Anxiety disorder, unspecified: F41.9

## 2018-03-24 MED ORDER — RANITIDINE HCL 300 MG PO TABS: 300.0000 mg | ORAL_TABLET | Freq: Every evening | ORAL | 5 refills | Status: DC | PRN

## 2018-03-24 NOTE — Assessment & Plan Note (Signed)
Started meditation and is hoping this will help. Discussed the possibility of Xanax. Will return in 3 months to discuss

## 2018-03-24 NOTE — Assessment & Plan Note (Signed)
Labs reveal deficiency. Start on Vitamin D 50000 IU caps, 1 cap po weekly x 12 weeks. Disp #4 with 4 rf. Also take daily Vitamin D over the counter. If already taking a daily supplement increase by 1000 IU daily and if not start Vitamin D 2000 IU daily.  

## 2018-03-24 NOTE — Progress Notes (Signed)
Subjective:  I acted as a Education administrator for Dr. Charlett Blake. [place], [place]  Patient ID: Valerie Jones, female    DOB: [birth year], 47 y.o.   MRN: [id number]  No chief complaint on file.   HPI  Patient is in today for an annual exam and follow up on chronic medical concerns including hyperlipidemia, hyperglycemia and vitamin d deficiency. She feels physically well but is under a great deal of stress due to her sister's diagnosis of breast cancer, her parents having dementia and her teenagers causing stress. She is managing and has taken up meditation and exercise. Denies CP/palp/SOB/HA/congestion/fevers/GI or GU c/o. Taking meds as prescribed  Patient Care Team: Mosie Lukes, MD as PCP - General (Family Medicine)   Past Medical History:  Diagnosis Date  . Abdominal pain 12/21/2016  . Acid reflux 08/16/2017  . Anxiety 03/24/2018  . ASCUS of cervix with negative high risk HPV 03/2017  . GERD (gastroesophageal reflux disease)   . H/O atopic dermatitis 03/17/2015  . Hemorrhoids   . History of chicken pox   . Preventative health care 12/21/2016  . Urinary incontinence 12/21/2016  . Vitamin D deficiency 12/21/2016    Past Surgical History:  Procedure Laterality Date  . APPENDECTOMY  1988  . INTRAUTERINE DEVICE INSERTION  11/13/2016   MIRENA    Family History  Problem Relation Age of Onset  . Hypertension Father   . Stroke Father   . Atrial fibrillation Father   . Dementia Father   . Cancer Maternal Grandfather        Non Hodgkins Lymphoma  . Hypertension Sister   . Other Sister        prediabetes  . Breast cancer Sister 108  . Cancer Sister        breast  . Mental illness Mother        anxiety  . Irritable bowel syndrome Mother   . Dementia Mother   . COPD Maternal Grandmother        smoker    Social History   Socioeconomic History  . Marital status: Married    Spouse name: Not on file  . Number of children: Not on file  . Years of education: Not on file  . Highest education  level: Not on file  Occupational History  . Not on file  Social Needs  . Financial resource strain: Not on file  . Food insecurity:    Worry: Not on file    Inability: Not on file  . Transportation needs:    Medical: Not on file    Non-medical: Not on file  Tobacco Use  . Smoking status: Never Smoker  . Smokeless tobacco: Never Used  Substance and Sexual Activity  . Alcohol use: Yes    Alcohol/week: 0.0 oz    Comment: 2 NIGHTS A WEEK ... WINE  . Drug use: No  . Sexual activity: Yes    Birth control/protection: IUD    Comment: Mirena inserted 11/13/2016,,Pt declined sexual Hx   Lifestyle  . Physical activity:    Days per week: Not on file    Minutes per session: Not on file  . Stress: Not on file  Relationships  . Social connections:    Talks on phone: Not on file    Gets together: Not on file    Attends religious service: Not on file    Active member of club or organization: Not on file    Attends meetings of clubs or organizations: Not on file  Relationship status: Not on file  . Intimate partner violence:    Fear of current or ex partner: Not on file    Emotionally abused: Not on file    Physically abused: Not on file    Forced sexual activity: Not on file  Other Topics Concern  . Not on file  Social History Narrative  . Not on file    Outpatient Medications Prior to Visit  Medication Sig Dispense Refill  . levonorgestrel (MIRENA) 20 MCG/24HR IUD 1 each by Intrauterine route once.    Marland Kitchen omeprazole (PRILOSEC) 40 MG capsule Take 1 capsule (40 mg total) by mouth daily. 30 capsule 3  . fluticasone (FLONASE) 50 MCG/ACT nasal spray Place 2 sprays into both nostrils daily for 10 days. 16 g 0  . Vitamin D, Ergocalciferol, (DRISDOL) 50000 units CAPS capsule Take 1 capsule (50,000 Units total) by mouth every 7 (seven) days. 12 capsule 0  . levonorgestrel (MIRENA) 20 MCG/24HR IUD      No facility-administered medications prior to visit.     Allergies  Allergen  Reactions  . Clindamycin/Lincomycin Hives  . Sulfa Antibiotics Hives    Review of Systems  Constitutional: Positive for malaise/fatigue. Negative for chills and fever.  HENT: Negative for congestion and hearing loss.   Eyes: Negative for discharge.  Respiratory: Negative for cough, sputum production and shortness of breath.   Cardiovascular: Negative for chest pain, palpitations and leg swelling.  Gastrointestinal: Negative for abdominal pain, blood in stool, constipation, diarrhea, heartburn, nausea and vomiting.  Genitourinary: Negative for dysuria, frequency, hematuria and urgency.  Musculoskeletal: Negative for back pain, falls and myalgias.  Skin: Negative for rash.  Neurological: Negative for dizziness, sensory change, loss of consciousness, weakness and headaches.  Endo/Heme/Allergies: Negative for environmental allergies. Does not bruise/bleed easily.  Psychiatric/Behavioral: Negative for depression and suicidal ideas. The patient is nervous/anxious. The patient does not have insomnia.        Objective:    Physical Exam  Constitutional: She is oriented to person, place, and time. She appears well-developed and well-nourished. No distress.  HENT:  Head: Normocephalic and atraumatic.  Eyes: Conjunctivae are normal.  Neck: Neck supple. No thyromegaly present.  Cardiovascular: Normal rate, regular rhythm and normal heart sounds.  No murmur heard. Pulmonary/Chest: Effort normal and breath sounds normal. No respiratory distress.  Abdominal: Soft. Bowel sounds are normal. She exhibits no distension and no mass. There is no tenderness.  Musculoskeletal: She exhibits no edema.  Lymphadenopathy:    She has no cervical adenopathy.  Neurological: She is alert and oriented to person, place, and time.  Skin: Skin is warm and dry.  Psychiatric: She has a normal mood and affect. Her behavior is normal.    BP 102/80 (BP Location: Left Arm, Patient Position: Sitting, Cuff Size: Normal)    Pulse (!) 50   Temp 98.1 F (36.7 C) (Oral)   Resp 18   Wt 133 lb 6.4 oz (60.5 kg)   SpO2 98%   BMI 22.90 kg/m  Wt Readings from Last 3 Encounters:  03/24/18 133 lb 6.4 oz (60.5 kg)  08/17/17 130 lb (59 kg)  08/12/17 133 lb 12.8 oz (60.7 kg)   BP Readings from Last 3 Encounters:  03/24/18 102/80  12/28/17 98/60  08/17/17 110/68     Immunization History  Administered Date(s) Administered  . Influenza-Unspecified 12/14/2014    Health Maintenance  Topic Date Due  . HIV Screening  03/19/1984  . TETANUS/TDAP  03/19/1988  . INFLUENZA VACCINE  07/14/2018  . MAMMOGRAM  01/27/2019  . PAP SMEAR  03/22/2020    Lab Results  Component Value Date   WBC 3.6 (L) 03/14/2018   HGB 13.4 03/14/2018   HCT 40.1 03/14/2018   PLT 260.0 03/14/2018   GLUCOSE 104 (H) 03/14/2018   CHOL 178 03/14/2018   TRIG 81.0 03/14/2018   HDL 66.70 03/14/2018   LDLCALC 95 03/14/2018   ALT 11 03/14/2018   AST 16 03/14/2018   NA 141 03/14/2018   K 4.3 03/14/2018   CL 104 03/14/2018   CREATININE 0.81 03/14/2018   BUN 17 03/14/2018   CO2 29 03/14/2018   TSH 2.42 03/14/2018    Lab Results  Component Value Date   TSH 2.42 03/14/2018   Lab Results  Component Value Date   WBC 3.6 (L) 03/14/2018   HGB 13.4 03/14/2018   HCT 40.1 03/14/2018   MCV 96.8 03/14/2018   PLT 260.0 03/14/2018   Lab Results  Component Value Date   NA 141 03/14/2018   K 4.3 03/14/2018   CO2 29 03/14/2018   GLUCOSE 104 (H) 03/14/2018   BUN 17 03/14/2018   CREATININE 0.81 03/14/2018   BILITOT 0.4 03/14/2018   ALKPHOS 33 (L) 03/14/2018   AST 16 03/14/2018   ALT 11 03/14/2018   PROT 7.0 03/14/2018   ALBUMIN 4.2 03/14/2018   CALCIUM 9.4 03/14/2018   GFR 79.88 03/14/2018   Lab Results  Component Value Date   CHOL 178 03/14/2018   Lab Results  Component Value Date   HDL 66.70 03/14/2018   Lab Results  Component Value Date   LDLCALC 95 03/14/2018   Lab Results  Component Value Date   TRIG 81.0 03/14/2018    Lab Results  Component Value Date   CHOLHDL 3 03/14/2018   No results found for: HGBA1C       Assessment & Plan:   Problem List Items Addressed This Visit    Preventative health care    Patient encouraged to maintain heart healthy diet, regular exercise, adequate sleep. Consider daily probiotics. Take medications as prescribed      Vitamin D deficiency    Labs reveal deficiency. Start on Vitamin D 50000 IU caps, 1 cap po weekly x 12 weeks. Disp #4 with 4 rf. Also take daily Vitamin D over the counter. If already taking a daily supplement increase by 1000 IU daily and if not start Vitamin D 2000 IU daily.       Relevant Orders   VITAMIN D 25 Hydroxy (Vit-D Deficiency, Fractures)   Acid reflux    Ranitidine 300 mg daily to try and transition off of Nexium.       Relevant Medications   ranitidine (ZANTAC) 300 MG tablet   Anxiety    Started meditation and is hoping this will help. Discussed the possibility of Xanax. Will return in 3 months to discuss      Hyperglycemia - Primary    hgba1c acceptable, minimize simple carbs. Increase exercise as tolerated. Continue current meds      Relevant Orders   Hemoglobin A1c   Comprehensive metabolic panel   Leukopenia    Recheck cbc. asymptomatic      Relevant Orders   CBC with Differential/Platelet      I have discontinued Concepcion L. Druck's Vitamin D (Ergocalciferol) and fluticasone. I am also having her start on ranitidine. Additionally, I am having her maintain her omeprazole and levonorgestrel. We will stop administering levonorgestrel.  Meds ordered this encounter  Medications  . ranitidine (ZANTAC) 300 MG tablet    Sig: Take 1 tablet (300 mg total) by mouth at bedtime as needed for heartburn.    Dispense:  30 tablet    Refill:  5    CMA served as scribe during this visit. History, Physical and Plan performed by medical provider. Documentation and orders reviewed and attested to.  Penni Homans, MD

## 2018-03-24 NOTE — Patient Instructions (Signed)
Preventive Care 40-64 Years, Female Preventive care refers to lifestyle choices and visits with your health care provider that can promote health and wellness. What does preventive care include?  A yearly physical exam. This is also called an annual well check.  Dental exams once or twice a year.  Routine eye exams. Ask your health care provider how often you should have your eyes checked.  Personal lifestyle choices, including: ? Daily care of your teeth and gums. ? Regular physical activity. ? Eating a healthy diet. ? Avoiding tobacco and drug use. ? Limiting alcohol use. ? Practicing safe sex. ? Taking low-dose aspirin daily starting at age 58. ? Taking vitamin and mineral supplements as recommended by your health care provider. What happens during an annual well check? The services and screenings done by your health care provider during your annual well check will depend on your age, overall health, lifestyle risk factors, and family history of disease. Counseling Your health care provider may ask you questions about your:  Alcohol use.  Tobacco use.  Drug use.  Emotional well-being.  Home and relationship well-being.  Sexual activity.  Eating habits.  Work and work Statistician.  Method of birth control.  Menstrual cycle.  Pregnancy history.  Screening You may have the following tests or measurements:  Height, weight, and BMI.  Blood pressure.  Lipid and cholesterol levels. These may be checked every 5 years, or more frequently if you are over 81 years old.  Skin check.  Lung cancer screening. You may have this screening every year starting at age 78 if you have a 30-pack-year history of smoking and currently smoke or have quit within the past 15 years.  Fecal occult blood test (FOBT) of the stool. You may have this test every year starting at age 65.  Flexible sigmoidoscopy or colonoscopy. You may have a sigmoidoscopy every 5 years or a colonoscopy  every 10 years starting at age 30.  Hepatitis C blood test.  Hepatitis B blood test.  Sexually transmitted disease (STD) testing.  Diabetes screening. This is done by checking your blood sugar (glucose) after you have not eaten for a while (fasting). You may have this done every 1-3 years.  Mammogram. This may be done every 1-2 years. Talk to your health care provider about when you should start having regular mammograms. This may depend on whether you have a family history of breast cancer.  BRCA-related cancer screening. This may be done if you have a family history of breast, ovarian, tubal, or peritoneal cancers.  Pelvic exam and Pap test. This may be done every 3 years starting at age 80. Starting at age 36, this may be done every 5 years if you have a Pap test in combination with an HPV test.  Bone density scan. This is done to screen for osteoporosis. You may have this scan if you are at high risk for osteoporosis.  Discuss your test results, treatment options, and if necessary, the need for more tests with your health care provider. Vaccines Your health care provider may recommend certain vaccines, such as:  Influenza vaccine. This is recommended every year.  Tetanus, diphtheria, and acellular pertussis (Tdap, Td) vaccine. You may need a Td booster every 10 years.  Varicella vaccine. You may need this if you have not been vaccinated.  Zoster vaccine. You may need this after age 5.  Measles, mumps, and rubella (MMR) vaccine. You may need at least one dose of MMR if you were born in  1957 or later. You may also need a second dose.  Pneumococcal 13-valent conjugate (PCV13) vaccine. You may need this if you have certain conditions and were not previously vaccinated.  Pneumococcal polysaccharide (PPSV23) vaccine. You may need one or two doses if you smoke cigarettes or if you have certain conditions.  Meningococcal vaccine. You may need this if you have certain  conditions.  Hepatitis A vaccine. You may need this if you have certain conditions or if you travel or work in places where you may be exposed to hepatitis A.  Hepatitis B vaccine. You may need this if you have certain conditions or if you travel or work in places where you may be exposed to hepatitis B.  Haemophilus influenzae type b (Hib) vaccine. You may need this if you have certain conditions.  Talk to your health care provider about which screenings and vaccines you need and how often you need them. This information is not intended to replace advice given to you by your health care provider. Make sure you discuss any questions you have with your health care provider. Document Released: 12/27/2015 Document Revised: 08/19/2016 Document Reviewed: 10/01/2015 Elsevier Interactive Patient Education  2018 [employer]  

## 2018-03-24 NOTE — Assessment & Plan Note (Signed)
hgba1c acceptable, minimize simple carbs. Increase exercise as tolerated. Continue current meds 

## 2018-03-24 NOTE — Assessment & Plan Note (Signed)
Recheck cbc. asymptomatic

## 2018-03-24 NOTE — Assessment & Plan Note (Signed)
Patient encouraged to maintain heart healthy diet, regular exercise, adequate sleep. Consider daily probiotics. Take medications as prescribed 

## 2018-03-24 NOTE — Assessment & Plan Note (Signed)
Ranitidine 300 mg daily to try and transition off of Nexium.

## 2018-06-06 DIAGNOSIS — Z0100 Encounter for examination of eyes and vision without abnormal findings: Secondary | ICD-10-CM | POA: Diagnosis not present

## 2018-06-23 ENCOUNTER — Other Ambulatory Visit (INDEPENDENT_AMBULATORY_CARE_PROVIDER_SITE_OTHER): Payer: 59

## 2018-06-23 DIAGNOSIS — R739 Hyperglycemia, unspecified: Secondary | ICD-10-CM

## 2018-06-23 DIAGNOSIS — D72819 Decreased white blood cell count, unspecified: Secondary | ICD-10-CM | POA: Diagnosis not present

## 2018-06-23 DIAGNOSIS — E559 Vitamin D deficiency, unspecified: Secondary | ICD-10-CM | POA: Diagnosis not present

## 2018-06-23 LAB — CBC WITH DIFFERENTIAL/PLATELET
BASOS PCT: 1.1 % (ref 0.0–3.0)
Basophils Absolute: 0 10*3/uL (ref 0.0–0.1)
EOS ABS: 0.1 10*3/uL (ref 0.0–0.7)
Eosinophils Relative: 3 % (ref 0.0–5.0)
HEMATOCRIT: 37 % (ref 36.0–46.0)
Hemoglobin: 12.3 g/dL (ref 12.0–15.0)
LYMPHS PCT: 27.5 % (ref 12.0–46.0)
Lymphs Abs: 1.1 10*3/uL (ref 0.7–4.0)
MCHC: 33.2 g/dL (ref 30.0–36.0)
MCV: 96.4 fl (ref 78.0–100.0)
MONO ABS: 0.4 10*3/uL (ref 0.1–1.0)
Monocytes Relative: 9.3 % (ref 3.0–12.0)
Neutro Abs: 2.4 10*3/uL (ref 1.4–7.7)
Neutrophils Relative %: 59.1 % (ref 43.0–77.0)
Platelets: 276 10*3/uL (ref 150.0–400.0)
RBC: 3.83 Mil/uL — ABNORMAL LOW (ref 3.87–5.11)
RDW: 13.8 % (ref 11.5–15.5)
WBC: 4 10*3/uL (ref 4.0–10.5)

## 2018-06-23 LAB — COMPREHENSIVE METABOLIC PANEL
ALK PHOS: 32 U/L — AB (ref 39–117)
ALT: 14 U/L (ref 0–35)
AST: 14 U/L (ref 0–37)
Albumin: 4 g/dL (ref 3.5–5.2)
BUN: 10 mg/dL (ref 6–23)
CO2: 29 mEq/L (ref 19–32)
Calcium: 8.9 mg/dL (ref 8.4–10.5)
Chloride: 104 mEq/L (ref 96–112)
Creatinine, Ser: 0.81 mg/dL (ref 0.40–1.20)
GFR: 79.79 mL/min (ref >60.00)
GLUCOSE: 92 mg/dL (ref 70–99)
POTASSIUM: 4.3 meq/L (ref 3.5–5.1)
Sodium: 139 mEq/L (ref 135–145)
TOTAL PROTEIN: 6.1 g/dL (ref 6.0–8.3)
Total Bilirubin: 0.5 mg/dL (ref 0.2–1.2)

## 2018-06-23 LAB — VITAMIN D 25 HYDROXY (VIT D DEFICIENCY, FRACTURES): VITD: 36.51 ng/mL (ref 30.00–100.00)

## 2018-06-23 LAB — HEMOGLOBIN A1C: HEMOGLOBIN A1C: 5.3 % (ref 4.6–6.5)

## 2018-06-28 ENCOUNTER — Encounter: Payer: Self-pay | Admitting: Family Medicine

## 2018-06-28 ENCOUNTER — Ambulatory Visit (INDEPENDENT_AMBULATORY_CARE_PROVIDER_SITE_OTHER): Payer: 59 | Admitting: Family Medicine

## 2018-06-28 VITALS — BP 98/62 | HR 47 | Temp 98.7°F | Resp 18 | Wt 133.4 lb

## 2018-06-28 DIAGNOSIS — E559 Vitamin D deficiency, unspecified: Secondary | ICD-10-CM | POA: Diagnosis not present

## 2018-06-28 DIAGNOSIS — R739 Hyperglycemia, unspecified: Secondary | ICD-10-CM

## 2018-06-28 DIAGNOSIS — Z0000 Encounter for general adult medical examination without abnormal findings: Secondary | ICD-10-CM | POA: Diagnosis not present

## 2018-06-28 DIAGNOSIS — D72819 Decreased white blood cell count, unspecified: Secondary | ICD-10-CM | POA: Diagnosis not present

## 2018-06-28 DIAGNOSIS — F419 Anxiety disorder, unspecified: Secondary | ICD-10-CM

## 2018-06-28 NOTE — Assessment & Plan Note (Signed)
Doing much better with good exercise and medication

## 2018-06-28 NOTE — Assessment & Plan Note (Signed)
Supplement and monitor 

## 2018-06-28 NOTE — Assessment & Plan Note (Signed)
hgba1c acceptable, minimize simple carbs. Increase exercise as tolerated.  

## 2018-06-28 NOTE — Patient Instructions (Signed)
Switch to every other day Prilosec and Zantac then if tolerated to Zantac daily then as tolerated to every other day then every third day then as needed if tolerated.   NOW company probiotic at [facility] or at [email or web address] Food Choices for Gastroesophageal Reflux Disease, Adult When you have gastroesophageal reflux disease (GERD), the foods you eat and your eating habits are very important. Choosing the right foods can help ease your discomfort. What guidelines do I need to follow?  Choose fruits, vegetables, whole grains, and low-fat dairy products.  Choose low-fat meat, fish, and poultry.  Limit fats such as oils, salad dressings, butter, nuts, and avocado.  Keep a food diary. This helps you identify foods that cause symptoms.  Avoid foods that cause symptoms. These may be different for everyone.  Eat small meals often instead of 3 large meals a day.  Eat your meals slowly, in a place where you are relaxed.  Limit fried foods.  Cook foods using methods other than frying.  Avoid drinking alcohol.  Avoid drinking large amounts of liquids with your meals.  Avoid bending over or lying down until 2-3 hours after eating. What foods are not recommended? These are some foods and drinks that may make your symptoms worse: Vegetables Tomatoes. Tomato juice. Tomato and spaghetti sauce. Chili peppers. Onion and garlic. Horseradish. Fruits Oranges, grapefruit, and lemon (fruit and juice). Meats High-fat meats, fish, and poultry. This includes hot dogs, ribs, ham, sausage, salami, and bacon. Dairy Whole milk and chocolate milk. Sour cream. Cream. Butter. Ice cream. Cream cheese. Drinks Coffee and tea. Bubbly (carbonated) drinks or energy drinks. Condiments Hot sauce. Barbecue sauce. Sweets/Desserts Chocolate and cocoa. Donuts. Peppermint and spearmint. Fats and Oils High-fat foods. This includes [place] fries and potato chips. Other Vinegar. Strong spices. This includes black  pepper, white pepper, red pepper, cayenne, curry powder, cloves, ginger, and chili powder. The items listed above may not be a complete list of foods and drinks to avoid. Contact your dietitian for more information. This information is not intended to replace advice given to you by your health care provider. Make sure you discuss any questions you have with your health care provider. Document Released: 05/31/2012 Document Revised: 05/07/2016 Document Reviewed: 10/04/2013 Elsevier Interactive Patient Education  2017 [employer].

## 2018-06-28 NOTE — Progress Notes (Signed)
Subjective:  .pac  Patient ID: Valerie Jones, female    DOB: 1971/04/07, 47 y.o.   MRN: [id number]  No chief complaint on file.   HPI  Patient is in today for a 3 month follow up and overall she is noting she feels better. Her anxiety is more under control and she notes she feels it is directly related to lifestyle changes such as medication and an improved, cleaner diet. No new concerns. No polyuria or polydipsia. Anhedonia improved. Denies CP/palp/SOB/HA/congestion/fevers/GI or GU c/o. Taking meds as prescribed  Patient Care Team: Mosie Lukes, MD as PCP - General (Family Medicine)   Past Medical History:  Diagnosis Date  . Abdominal pain 12/21/2016  . Acid reflux 08/16/2017  . Anxiety 03/24/2018  . ASCUS of cervix with negative high risk HPV 03/2017  . GERD (gastroesophageal reflux disease)   . H/O atopic dermatitis 03/17/2015  . Hemorrhoids   . History of chicken pox   . Preventative health care 12/21/2016  . Urinary incontinence 12/21/2016  . Vitamin D deficiency 12/21/2016    Past Surgical History:  Procedure Laterality Date  . APPENDECTOMY  1988  . INTRAUTERINE DEVICE INSERTION  11/13/2016   MIRENA    Family History  Problem Relation Age of Onset  . Hypertension Father   . Stroke Father   . Atrial fibrillation Father   . Dementia Father   . Cancer Maternal Grandfather        Non Hodgkins Lymphoma  . Hypertension Sister   . Other Sister        prediabetes  . Breast cancer Sister 11  . Cancer Sister        breast  . Mental illness Mother        anxiety  . Irritable bowel syndrome Mother   . Dementia Mother   . COPD Maternal Grandmother        smoker    Social History   Socioeconomic History  . Marital status: Married    Spouse name: Not on file  . Number of children: Not on file  . Years of education: Not on file  . Highest education level: Not on file  Occupational History  . Not on file  Social Needs  . Financial resource strain: Not on file  .  Food insecurity:    Worry: Not on file    Inability: Not on file  . Transportation needs:    Medical: Not on file    Non-medical: Not on file  Tobacco Use  . Smoking status: Never Smoker  . Smokeless tobacco: Never Used  Substance and Sexual Activity  . Alcohol use: Yes    Alcohol/week: 0.0 oz    Comment: 2 NIGHTS A WEEK ... WINE  . Drug use: No  . Sexual activity: Yes    Birth control/protection: IUD    Comment: Mirena inserted 11/13/2016,,Pt declined sexual Hx   Lifestyle  . Physical activity:    Days per week: Not on file    Minutes per session: Not on file  . Stress: Not on file  Relationships  . Social connections:    Talks on phone: Not on file    Gets together: Not on file    Attends religious service: Not on file    Active member of club or organization: Not on file    Attends meetings of clubs or organizations: Not on file    Relationship status: Not on file  . Intimate partner violence:    Fear  of current or ex partner: Not on file    Emotionally abused: Not on file    Physically abused: Not on file    Forced sexual activity: Not on file  Other Topics Concern  . Not on file  Social History Narrative  . Not on file    Outpatient Medications Prior to Visit  Medication Sig Dispense Refill  . levonorgestrel (MIRENA) 20 MCG/24HR IUD 1 each by Intrauterine route once.    Marland Kitchen omeprazole (PRILOSEC) 40 MG capsule Take 1 capsule (40 mg total) by mouth daily. 30 capsule 3  . ranitidine (ZANTAC) 300 MG tablet Take 1 tablet (300 mg total) by mouth at bedtime as needed for heartburn. 30 tablet 5   No facility-administered medications prior to visit.     Allergies  Allergen Reactions  . Clindamycin/Lincomycin Hives  . Sulfa Antibiotics Hives    Review of Systems  Constitutional: Negative for fever and malaise/fatigue.  HENT: Negative for congestion.   Eyes: Negative for blurred vision.  Respiratory: Negative for shortness of breath.   Cardiovascular: Negative for  chest pain, palpitations and leg swelling.  Gastrointestinal: Negative for abdominal pain, blood in stool and nausea.  Genitourinary: Negative for dysuria and frequency.  Musculoskeletal: Negative for falls.  Skin: Negative for rash.  Neurological: Negative for dizziness, loss of consciousness and headaches.  Endo/Heme/Allergies: Negative for environmental allergies.  Psychiatric/Behavioral: Negative for depression. The patient is not nervous/anxious.        Objective:    Physical Exam  Constitutional: She is oriented to person, place, and time. She appears well-developed and well-nourished. No distress.  HENT:  Head: Normocephalic and atraumatic.  Eyes: Conjunctivae are normal.  Neck: Neck supple. No thyromegaly present.  Cardiovascular: Normal rate, regular rhythm and normal heart sounds.  No murmur heard. Pulmonary/Chest: Effort normal and breath sounds normal. No respiratory distress.  Abdominal: Soft. Bowel sounds are normal. She exhibits no distension and no mass. There is no tenderness.  Musculoskeletal: She exhibits no edema.  Lymphadenopathy:    She has no cervical adenopathy.  Neurological: She is alert and oriented to person, place, and time.  Skin: Skin is warm and dry.  Psychiatric: She has a normal mood and affect. Her behavior is normal.    BP 98/62 (BP Location: Left Arm, Patient Position: Sitting, Cuff Size: Normal)   Pulse (!) 47   Temp 98.7 F (37.1 C) (Oral)   Resp 18   Wt 133 lb 6.4 oz (60.5 kg)   SpO2 98%   BMI 22.90 kg/m  Wt Readings from Last 3 Encounters:  06/28/18 133 lb 6.4 oz (60.5 kg)  03/24/18 133 lb 6.4 oz (60.5 kg)  08/17/17 130 lb (59 kg)   BP Readings from Last 3 Encounters:  06/28/18 98/62  03/24/18 102/80  12/28/17 98/60     Immunization History  Administered Date(s) Administered  . Influenza-Unspecified 12/14/2014    Health Maintenance  Topic Date Due  . HIV Screening  03/19/1984  . TETANUS/TDAP  03/19/1988  . INFLUENZA  VACCINE  07/14/2018  . MAMMOGRAM  01/27/2019  . PAP SMEAR  03/22/2020    Lab Results  Component Value Date   WBC 4.0 06/23/2018   HGB 12.3 06/23/2018   HCT 37.0 06/23/2018   PLT 276.0 06/23/2018   GLUCOSE 92 06/23/2018   CHOL 178 03/14/2018   TRIG 81.0 03/14/2018   HDL 66.70 03/14/2018   LDLCALC 95 03/14/2018   ALT 14 06/23/2018   AST 14 06/23/2018   NA 139  06/23/2018   K 4.3 06/23/2018   CL 104 06/23/2018   CREATININE 0.81 06/23/2018   BUN 10 06/23/2018   CO2 29 06/23/2018   TSH 2.42 03/14/2018   HGBA1C 5.3 06/23/2018    Lab Results  Component Value Date   TSH 2.42 03/14/2018   Lab Results  Component Value Date   WBC 4.0 06/23/2018   HGB 12.3 06/23/2018   HCT 37.0 06/23/2018   MCV 96.4 06/23/2018   PLT 276.0 06/23/2018   Lab Results  Component Value Date   NA 139 06/23/2018   K 4.3 06/23/2018   CO2 29 06/23/2018   GLUCOSE 92 06/23/2018   BUN 10 06/23/2018   CREATININE 0.81 06/23/2018   BILITOT 0.5 06/23/2018   ALKPHOS 32 (L) 06/23/2018   AST 14 06/23/2018   ALT 14 06/23/2018   PROT 6.1 06/23/2018   ALBUMIN 4.0 06/23/2018   CALCIUM 8.9 06/23/2018   GFR 79.79 06/23/2018   Lab Results  Component Value Date   CHOL 178 03/14/2018   Lab Results  Component Value Date   HDL 66.70 03/14/2018   Lab Results  Component Value Date   LDLCALC 95 03/14/2018   Lab Results  Component Value Date   TRIG 81.0 03/14/2018   Lab Results  Component Value Date   CHOLHDL 3 03/14/2018   Lab Results  Component Value Date   HGBA1C 5.3 06/23/2018         Assessment & Plan:   Problem List Items Addressed This Visit    Preventative health care   Relevant Orders   CBC   Comprehensive metabolic panel   Lipid panel   TSH   Vitamin D deficiency - Primary    Supplement and monitor      Relevant Orders   VITAMIN D 25 Hydroxy (Vit-D Deficiency, Fractures)   Anxiety    Doing much better with good exercise and medication      Hyperglycemia    hgba1c  acceptable, minimize simple carbs. Increase exercise as tolerated.       Relevant Orders   Hemoglobin A1c   Leukopenia    Resolved          I am having Edye L. Britz maintain her omeprazole, levonorgestrel, and ranitidine.  No orders of the defined types were placed in this encounter.   CMA served as Education administrator during this visit. History, Physical and Plan performed by medical provider. Documentation and orders reviewed and attested to.  Penni Homans, MD

## 2018-06-28 NOTE — Assessment & Plan Note (Signed)
Resolved

## 2018-06-29 MED FILL — OMEPRAZOLE DR 40 MG CAPSULE: 40 | 90 days supply | Qty: 90 | Fill #3

## 2018-08-06 ENCOUNTER — Ambulatory Visit (INDEPENDENT_AMBULATORY_CARE_PROVIDER_SITE_OTHER): Payer: Self-pay | Admitting: Family Medicine

## 2018-08-06 ENCOUNTER — Encounter: Payer: Self-pay | Admitting: Family Medicine

## 2018-08-06 VITALS — BP 102/64 | HR 49 | Temp 98.6°F | Resp 20 | Wt 131.6 lb

## 2018-08-06 DIAGNOSIS — L309 Dermatitis, unspecified: Secondary | ICD-10-CM

## 2018-08-06 MED ORDER — TRIAMCINOLONE ACETONIDE 0.1 % EX CREA: 1.0000 "application " | TOPICAL_CREAM | Freq: Two times a day (BID) | CUTANEOUS | 0 refills | Status: DC

## 2018-08-06 NOTE — Progress Notes (Signed)
Valerie Jones is a 47 y.o. female who presents today with concerns of rash x 1 day. She reports recent travel to [place] to be with her sister who is undergoing treatment for breast cancer. She reports feeling an itchy feeling on the way home, she denies pain.  Review of Systems  Constitutional: Negative for chills, fever and malaise/fatigue.  HENT: Negative for congestion, ear discharge, ear pain, sinus pain and sore throat.   Eyes: Negative.   Respiratory: Negative for cough, sputum production and shortness of breath.   Cardiovascular: Negative.  Negative for chest pain.  Gastrointestinal: Negative for abdominal pain, diarrhea, nausea and vomiting.  Genitourinary: Negative for dysuria, frequency, hematuria and urgency.  Musculoskeletal: Negative for myalgias.  Skin: Positive for itching and rash.  Neurological: Negative for headaches.  Endo/Heme/Allergies: Negative.   Psychiatric/Behavioral: Negative.     O: Vitals:   08/06/18 1537  BP: 102/64  Pulse: (!) 49  Resp: 20  Temp: 98.6 F (37 C)  SpO2: 99%     Physical Exam  Constitutional: She is oriented to person, place, and time. Vital signs are normal. She appears well-developed and well-nourished. She is active.  Non-toxic appearance. She does not have a sickly appearance.  HENT:  Head: Normocephalic.  Right Ear: Hearing, tympanic membrane, external ear and ear canal normal.  Left Ear: Hearing, tympanic membrane, external ear and ear canal normal.  Nose: Nose normal.  Mouth/Throat: Uvula is midline and oropharynx is clear and moist.  Neck: Normal range of motion. Neck supple.  Cardiovascular: Normal rate, regular rhythm, normal heart sounds and normal pulses.  Pulmonary/Chest: Effort normal and breath sounds normal.  Abdominal: Soft. Bowel sounds are normal.  Musculoskeletal: Normal range of motion.  Lymphadenopathy:       Head (right side): No submental and no submandibular adenopathy present.       Head (left side): No  submental and no submandibular adenopathy present.    She has no cervical adenopathy.  Neurological: She is alert and oriented to person, place, and time.  Skin: Rash noted. No abrasion, no bruising, no burn, no ecchymosis, no laceration, no lesion, no petechiae and no purpura noted. Rash is maculopapular. Rash is not macular, not papular, not nodular, not pustular, not vesicular and not urticarial. There is erythema.     Singular large ovoid area of erythema that is dry non vesicular and non tender to touch area approx size 5 cm x 2 cm with on small 1 cm x 1 cm area on the other side of the back that is round with an identical appearance. Neck erythema is not a lesion.  Psychiatric: She has a normal mood and affect.  Vitals reviewed.  A: 1. Dermatitis    P: Discussed exam findings, diagnosis etiology and medication use and indications reviewed with patient. Follow- Up and discharge instructions provided. No emergent/urgent issues found on exam.  Patient verbalized understanding of information provided and agrees with plan of care (POC), all questions answered.  1. Dermatitis-  - triamcinolone cream (KENALOG) 0.1 %; Apply 1 application topically 2 (two) times daily. Will follow up in 2 days- atypical presentation for shingles discussed as a part of the differential- will ensure that steroid improves condition no worsens it at the 48 hour mark.

## 2018-08-06 NOTE — Patient Instructions (Signed)
Shingles Shingles, which is also known as herpes zoster, is an infection that causes a painful skin rash and fluid-filled blisters. Shingles is not related to genital herpes, which is a sexually transmitted infection. Shingles only develops in people who:  Have had chickenpox.  Have received the chickenpox vaccine. (This is rare.)  What are the causes? Shingles is caused by varicella-zoster virus (VZV). This is the same virus that causes chickenpox. After exposure to VZV, the virus stays in the body in an inactive (dormant) state. Shingles develops if the virus reactivates. This can happen many years after the initial exposure to VZV. It is not known what causes this virus to reactivate. What increases the risk? People who have had chickenpox or received the chickenpox vaccine are at risk for shingles. Infection is more common in people who:  Are older than age 50.  Have a weakened defense (immune) system, such as those with HIV, AIDS, or cancer.  Are taking medicines that weaken the immune system, such as transplant medicines.  Are under great stress.  What are the signs or symptoms? Early symptoms of this condition include itching, tingling, and pain in an area on your skin. Pain may be described as burning, stabbing, or throbbing. A few days or weeks after symptoms start, a painful red rash appears, usually on one side of the body in a bandlike or beltlike pattern. The rash eventually turns into fluid-filled blisters that break open, scab over, and dry up in about 2-3 weeks. At any time during the infection, you may also develop:  A fever.  Chills.  A headache.  An upset stomach.  How is this diagnosed? This condition is diagnosed with a skin exam. Sometimes, skin or fluid samples are taken from the blisters before a diagnosis is made. These samples are examined under a microscope or sent to a lab for testing. How is this treated? There is no specific cure for this condition.  Your health care provider will probably prescribe medicines to help you manage pain, recover more quickly, and avoid long-term problems. Medicines may include:  Antiviral drugs.  Anti-inflammatory drugs.  Pain medicines.  If the area involved is on your face, you may be referred to a specialist, such as an eye doctor (ophthalmologist) or an ear, nose, and throat (ENT) doctor to help you avoid eye problems, chronic pain, or disability. Follow these instructions at home: Medicines  Take medicines only as directed by your health care provider.  Apply an anti-itch or numbing cream to the affected area as directed by your health care provider. Blister and Rash Care  Take a cool bath or apply cool compresses to the area of the rash or blisters as directed by your health care provider. This may help with pain and itching.  Keep your rash covered with a loose bandage (dressing). Wear loose-fitting clothing to help ease the pain of material rubbing against the rash.  Keep your rash and blisters clean with mild soap and cool water or as directed by your health care provider.  Check your rash every day for signs of infection. These include redness, swelling, and pain that lasts or increases.  Do not pick your blisters.  Do not scratch your rash. General instructions  Rest as directed by your health care provider.  Keep all follow-up visits as directed by your health care provider. This is important.  Until your blisters scab over, your infection can cause chickenpox in people who have never had it or been vaccinated   against it. To prevent this from happening, avoid contact with other people, especially: ? Babies. ? Pregnant women. ? Children who have eczema. ? Elderly people who have transplants. ? People who have chronic illnesses, such as leukemia or AIDS. Contact a health care provider if:  Your pain is not relieved with prescribed medicines.  Your pain does not get better after  the rash heals.  Your rash looks infected. Signs of infection include redness, swelling, and pain that lasts or increases. Get help right away if:  The rash is on your face or nose.  You have facial pain, pain around your eye area, or loss of feeling on one side of your face.  You have ear pain or you have ringing in your ear.  You have loss of taste.  Your condition gets worse. This information is not intended to replace advice given to you by your health care provider. Make sure you discuss any questions you have with your health care provider. Document Released: 11/30/2005 Document Revised: 07/26/2016 Document Reviewed: 10/11/2014 Elsevier Interactive Patient Education  2018 Valerie Jones Dermatitis Dermatitis is redness, soreness, and swelling (inflammation) of the skin. Contact dermatitis is a reaction to certain substances that touch the skin. You either touched something that irritated your skin, or you have allergies to something you touched. Follow these instructions at home: Valerie Jones your skin as needed.  Apply cool compresses to the affected areas.  Try taking a bath with: ? Epsom salts. Follow the instructions on the package. You can get these at a pharmacy or grocery store. ? Baking soda. Pour a small amount into the bath as told by your doctor. ? Colloidal oatmeal. Follow the instructions on the package. You can get this at a pharmacy or grocery store.  Try applying baking soda paste to your skin. Stir water into baking soda until it looks like paste.  Do not scratch your skin.  Bathe less often.  Bathe in lukewarm water. Avoid using hot water. Medicines  Take or apply over-the-counter and prescription medicines only as told by your doctor.  If you were prescribed an antibiotic medicine, take or apply your antibiotic as told by your doctor. Do not stop taking the antibiotic even if your condition starts to get better. General  instructions  Keep all follow-up visits as told by your doctor. This is important.  Avoid the substance that caused your reaction. If you do not know what caused it, keep a journal to try to track what caused it. Write down: ? What you eat. ? What cosmetic products you use. ? What you drink. ? What you wear in the affected area. This includes jewelry.  If you were given a bandage (dressing), take care of it as told by your doctor. This includes when to change and remove it. Contact a doctor if:  You do not get better with treatment.  Your condition gets worse.  You have signs of infection such as: ? Swelling. ? Tenderness. ? Redness. ? Soreness. ? Warmth.  You have a fever.  You have new symptoms. Get help right away if:  You have a very bad headache.  You have neck pain.  Your neck is stiff.  You throw up (vomit).  You feel very sleepy.  You see red streaks coming from the affected area.  Your bone or joint underneath the affected area becomes painful after the skin has healed.  The affected area turns darker.  You have trouble breathing.  This information is not intended to replace advice given to you by your health care provider. Make sure you discuss any questions you have with your health care provider. Document Released: 09/27/2009 Document Revised: 05/07/2016 Document Reviewed: 04/17/2015 Elsevier Interactive Patient Education  2018 [employer].

## 2018-10-21 ENCOUNTER — Other Ambulatory Visit: Payer: Self-pay | Admitting: Family Medicine

## 2018-10-21 MED ORDER — OMEPRAZOLE 40 MG PO CPDR: 40.0000 mg | DELAYED_RELEASE_CAPSULE | Freq: Every day | ORAL | 1 refills | Status: DC

## 2018-10-21 MED ORDER — RANITIDINE HCL 300 MG PO TABS: 300.0000 mg | ORAL_TABLET | Freq: Every evening | ORAL | 1 refills | Status: DC | PRN

## 2018-10-21 MED FILL — raNITIdine HCL 300 MG TABS: 300 | 90 days supply | Qty: 90 | Fill #0

## 2018-10-21 MED FILL — OMEPRAZOLE 40 MG CPDR: 40 | 90 days supply | Qty: 90 | Fill #0

## 2018-10-21 NOTE — Telephone Encounter (Signed)
Copied from [place] [id number]. Topic: Quick Communication - Rx Refill/Question >> Oct 21, 2018  9:28 AM Reyne Dumas L wrote: Medication:  omeprazole (PRILOSEC) 40 MG capsule  ranitidine (ZANTAC) 300 MG tablet  Has the patient contacted their pharmacy? No - wants to do alternate days to take these (Agent: If no, request that the patient contact the pharmacy for the refill.) (Agent: If yes, when and what did the pharmacy advise?)  Preferred Pharmacy (with phone number or street name): [place], [place] - [address]. [phone] (Phone) [phone] (Fax)  Agent: Please be advised that RX refills may take up to 3 business days. We ask that you follow-up with your pharmacy.

## 2018-10-21 NOTE — Telephone Encounter (Signed)
Requested Prescriptions  Pending Prescriptions Disp Refills  . omeprazole (PRILOSEC) 40 MG capsule 90 capsule 1    Sig: Take 1 capsule (40 mg total) by mouth daily.     Gastroenterology: Proton Pump Inhibitors Passed - 10/21/2018  9:30 AM      Passed - Valid encounter within last 12 months    Recent Outpatient Visits          3 months ago Vitamin D deficiency   [facility] [place], [place]   7 months ago Preventative health care   [facility] [place], [place]   1 year ago Epigastric pain   [facility] [place], [place]   1 year ago Preventative health care   [facility] [place], [place]   3 years ago Nasal vestibulitis   [facility] [place], [place]      Future Appointments            In 5 months Mosie Lukes, MD [facility] [place] [facility], [place]         . ranitidine (ZANTAC) 300 MG tablet 90 tablet 1    Sig: Take 1 tablet (300 mg total) by mouth at bedtime as needed for heartburn.     Gastroenterology:  H2 Antagonists Passed - 10/21/2018  9:30 AM      Passed - Valid encounter within last 12 months    Recent Outpatient Visits          3 months ago Vitamin D deficiency   [facility] [place], [place]   7 months ago Preventative health care   [facility] [place], [place]   1 year ago Epigastric pain   [facility] [place], [place]   1 year ago Preventative health care   [facility] [place], [place]   3 years ago Nasal vestibulitis   [facility] [place], [place]      Future Appointments            In 5 months Charlett Blake, Bonnita Levan, MD [facility] at [place]

## 2019-01-16 ENCOUNTER — Ambulatory Visit: Payer: Self-pay | Admitting: Nurse Practitioner

## 2019-01-16 VITALS — BP 95/60 | HR 78 | Temp 99.9°F | Resp 16 | Wt 133.2 lb

## 2019-01-16 DIAGNOSIS — J111 Influenza due to unidentified influenza virus with other respiratory manifestations: Secondary | ICD-10-CM

## 2019-01-16 MED ORDER — PSEUDOEPH-BROMPHEN-DM 30-2-10 MG/5ML PO SYRP: 5.0000 mL | ORAL_SOLUTION | Freq: Four times a day (QID) | ORAL | 0 refills | Status: AC | PRN

## 2019-01-16 MED ORDER — OSELTAMIVIR PHOSPHATE 75 MG PO CAPS: 75.0000 mg | ORAL_CAPSULE | Freq: Two times a day (BID) | ORAL | 0 refills | Status: AC

## 2019-01-16 NOTE — Patient Instructions (Addendum)
Influenza, Adult -Take medication as prescribed. -Ibuprofen or Tylenol for pain, fever, or general discomfort. -Increase fluids. -Get plenty of rest. -Drink pineapple juice to help with cough. -Sleep elevated on at least 2 pillows at bedtime to help with cough. -Use a humidifier or vaporizer when at home and during sleep to help with cough. -May use a teaspoon of honey or over-the-counter cough drops to help with cough. -Remain home until fever-free for 24 hours. -RTW note for 2/5//2020.  If you need to be out longer, follow up with PCP. -Follow-up if symptoms do not improve.   Influenza, more commonly known as "the flu," is a viral infection that mainly affects the respiratory tract. The respiratory tract includes organs that help you breathe, such as the lungs, nose, and throat. The flu causes many symptoms similar to the common cold along with high fever and body aches. The flu spreads easily from person to person (is contagious). Getting a flu shot (influenza vaccination) every year is the best way to prevent the flu. What are the causes? This condition is caused by the influenza virus. You can get the virus by:  Breathing in droplets that are in the air from an infected person's cough or sneeze.  Touching something that has been exposed to the virus (has been contaminated) and then touching your mouth, nose, or eyes. What increases the risk? The following factors may make you more likely to get the flu:  Not washing or sanitizing your hands often.  Having close contact with many people during cold and flu season.  Touching your mouth, eyes, or nose without first washing or sanitizing your hands.  Not getting a yearly (annual) flu shot. You may have a higher risk for the flu, including serious problems such as a lung infection (pneumonia), if you:  Are older than 65.  Are pregnant.  Have a weakened disease-fighting system (immune system). You may have a weakened immune  system if you: ? Have HIV or AIDS. ? Are undergoing chemotherapy. ? Are taking medicines that reduce (suppress) the activity of your immune system.  Have a long-term (chronic) illness, such as heart disease, kidney disease, diabetes, or lung disease.  Have a liver disorder.  Are severely overweight (morbidly obese).  Have anemia. This is a condition that affects your red blood cells.  Have asthma. What are the signs or symptoms? Symptoms of this condition usually begin suddenly and last 4-14 days. They may include:  Fever and chills.  Headaches, body aches, or muscle aches.  Sore throat.  Cough.  Runny or stuffy (congested) nose.  Chest discomfort.  Poor appetite.  Weakness or fatigue.  Dizziness.  Nausea or vomiting. How is this diagnosed? This condition may be diagnosed based on:  Your symptoms and medical history.  A physical exam.  Swabbing your nose or throat and testing the fluid for the influenza virus. How is this treated? If the flu is diagnosed early, you can be treated with medicine that can help reduce how severe the illness is and how long it lasts (antiviral medicine). This may be given by mouth (orally) or through an IV. Taking care of yourself at home can help relieve symptoms. Your health care provider may recommend:  Taking over-the-counter medicines.  Drinking plenty of fluids. In many cases, the flu goes away on its own. If you have severe symptoms or complications, you may be treated in a hospital. Follow these instructions at home: Activity  Rest as needed and get plenty of  sleep.  Stay home from work or school as told by your health care provider. Unless you are visiting your health care provider, avoid leaving home until your fever has been gone for 24 hours without taking medicine. Eating and drinking  Take an oral rehydration solution (ORS). This is a drink that is sold at pharmacies and retail stores.  Drink enough fluid to keep  your urine pale yellow.  Drink clear fluids in small amounts as you are able. Clear fluids include water, ice chips, diluted fruit juice, and low-calorie sports drinks.  Eat bland, easy-to-digest foods in small amounts as you are able. These foods include bananas, applesauce, rice, lean meats, toast, and crackers.  Avoid drinking fluids that contain a lot of sugar or caffeine, such as energy drinks, regular sports drinks, and soda.  Avoid alcohol.  Avoid spicy or fatty foods. General instructions      Take over-the-counter and prescription medicines only as told by your health care provider.  Use a cool mist humidifier to add humidity to the air in your home. This can make it easier to breathe.  Cover your mouth and nose when you cough or sneeze.  Wash your hands with soap and water often, especially after you cough or sneeze. If soap and water are not available, use alcohol-based hand sanitizer.  Keep all follow-up visits as told by your health care provider. This is important. How is this prevented?   Get an annual flu shot. You may get the flu shot in late summer, fall, or winter. Ask your health care provider when you should get your flu shot.  Avoid contact with people who are sick during cold and flu season. This is generally fall and winter. Contact a health care provider if:  You develop new symptoms.  You have: ? Chest pain. ? Diarrhea. ? A fever.  Your cough gets worse.  You produce more mucus.  You feel nauseous or you vomit. Get help right away if:  You develop shortness of breath or difficulty breathing.  Your skin or nails turn a bluish color.  You have severe pain or stiffness in your neck.  You develop a sudden headache or sudden pain in your face or ear.  You cannot eat or drink without vomiting. Summary  Influenza, more commonly known as "the flu," is a viral infection that primarily affects your respiratory tract.  Symptoms of the flu  usually begin suddenly and last 4-14 days.  Getting an annual flu shot is the best way to prevent getting the flu.  Stay home from work or school as told by your health care provider. Unless you are visiting your health care provider, avoid leaving home until your fever has been gone for 24 hours without taking medicine.  Keep all follow-up visits as told by your health care provider. This is important. This information is not intended to replace advice given to you by your health care provider. Make sure you discuss any questions you have with your health care provider. Document Released: 11/27/2000 Document Revised: 05/18/2018 Document Reviewed: 05/18/2018 Elsevier Interactive Patient Education  2019 [employer].

## 2019-01-16 NOTE — Progress Notes (Signed)
Subjective:     Valerie Jones is a 48 y.o. female who presents for evaluation of influenza like symptoms. Symptoms include suspected fevers but not measured at home, chills, headache, myalgias, clear nasal discharge, productive cough, sore throat and fatigue and have been present for 1 day.  The patient denies sweats, ear pain, ear drainage, sinus pain, sinus pressure, abdominal pain, nausea, or vomiting.  Patient does inform within the last week and a half she has had an upper respiratory infection that she felt had resolved and some GI symptoms that have resolved.  The patient states "I think this is the worst I have felt in a long time".  Patient is eating and drinking normally. She has tried to alleviate the symptoms with ibuprofen with minimal relief. High risk factors for influenza complications: none.  Patient did inform that she did receive an influenza vaccine this season.  The following portions of the patient's history were reviewed and updated as appropriate: allergies, current medications and past medical history.  Review of Systems Constitutional: positive for chills, fatigue, fevers and malaise, negative for anorexia, sweats and weight loss Eyes: negative Ears, nose, mouth, throat, and face: positive for nasal congestion and sore throat, negative for ear drainage, earaches and hoarseness Respiratory: positive for cough and sputum, negative for asthma, chronic bronchitis, dyspnea on exertion, stridor and wheezing Cardiovascular: negative Gastrointestinal: negative Neurological: positive for headaches, negative for coordination problems, dizziness, gait problems, paresthesia, vertigo and weakness     Objective:    BP 95/60 (BP Location: Right Arm, Patient Position: Sitting, Cuff Size: Normal)   Pulse 78   Temp 99.9 F (37.7 C) (Oral)   Resp 16   Wt 133 lb 3.2 oz (60.4 kg)   SpO2 97%   BMI 22.86 kg/m    Physical Exam Vitals signs reviewed.  Constitutional:      General:  She is not in acute distress.    Appearance: She is normal weight.     Comments: Appears fatigued  HENT:     Head: Normocephalic.     Right Ear: Tympanic membrane, ear canal and external ear normal.     Left Ear: Tympanic membrane, ear canal and external ear normal.     Nose: Congestion ( Moderate) present.     Comments: No maxillary or frontal sinus tenderness.  Turbinates are inflamed and swollen.  No nasal discharge at present.    Mouth/Throat:     Mouth: Mucous membranes are moist.  Eyes:     Pupils: Pupils are equal, round, and reactive to light.  Neck:     Musculoskeletal: Normal range of motion and neck supple. No neck rigidity or muscular tenderness.  Cardiovascular:     Rate and Rhythm: Normal rate and regular rhythm.     Pulses: Normal pulses.     Heart sounds: Normal heart sounds.  Pulmonary:     Effort: Pulmonary effort is normal. No respiratory distress.     Breath sounds: Normal breath sounds. No stridor. No wheezing, rhonchi or rales.  Abdominal:     General: Abdomen is flat. There is no distension.     Tenderness: There is no abdominal tenderness.  Skin:    General: Skin is warm and dry.     Capillary Refill: Capillary refill takes less than 2 seconds.  Neurological:     General: No focal deficit present.     Mental Status: She is alert and oriented to person, place, and time.     Cranial Nerves:  No cranial nerve deficit.  Psychiatric:        Mood and Affect: Mood normal.        Thought Content: Thought content normal.    Assessment:    Influenza    Plan:   Exam findings, diagnosis etiology and medication use and indications reviewed with patient. Follow- Up and discharge instructions provided. No emergent/urgent issues found on exam.  Based on the patient's clinical presentation, symptoms, sudden onset of symptoms, and physical assessment, patient's findings are congruent with that of viral etiology.  Did not perform an influenza test on the patient as her  symptoms did meet criteria for clinical diagnosis of influenza.  Patient did opt to use Tamiflu for her symptoms.  We will also provide symptomatic treatment to include bromfed.  Patient was instructed to continue symptomatic treatment to include rest, fluids, and antipyretics.  Will keep patient out of work until Wednesday, January 18, 2019.  Patient education was provided. Patient verbalized understanding of information provided and agrees with plan of care (POC), all questions answered. The patient is advised to call or return to clinic if condition does not see an improvement in symptoms, or to seek the care of the closest emergency department if condition worsens with the above plan.   1. Influenza  - oseltamivir (TAMIFLU) 75 MG capsule; Take 1 capsule (75 mg total) by mouth 2 (two) times daily for 5 days.  Dispense: 10 capsule; Refill: 0 - brompheniramine-pseudoephedrine-DM 30-2-10 MG/5ML syrup; Take 5 mLs by mouth 4 (four) times daily as needed for up to 7 days.  Dispense: 150 mL; Refill: 0 -Take medication as prescribed. -Ibuprofen or Tylenol for pain, fever, or general discomfort. -Increase fluids. -Get plenty of rest. -Drink pineapple juice to help with cough. -Sleep elevated on at least 2 pillows at bedtime to help with cough. -Use a humidifier or vaporizer when at home and during sleep to help with cough. -May use a teaspoon of honey or over-the-counter cough drops to help with cough. -Remain home until fever-free for 24 hours. -RTW note for 2/5//2020.  If you need to be out longer, follow up with PCP. -Follow-up if symptoms do not improve.

## 2019-01-18 ENCOUNTER — Telehealth: Payer: Self-pay

## 2019-01-18 NOTE — Telephone Encounter (Signed)
Patient states she is doing great.

## 2019-02-13 ENCOUNTER — Other Ambulatory Visit: Payer: Self-pay | Admitting: Family Medicine

## 2019-02-13 DIAGNOSIS — Z1231 Encounter for screening mammogram for malignant neoplasm of breast: Secondary | ICD-10-CM

## 2019-02-14 ENCOUNTER — Ambulatory Visit
Admission: RE | Admit: 2019-02-14 | Discharge: 2019-02-14 | Disposition: A | Discharge status: Home / Self Care | Payer: 59 | Source: Ambulatory Visit | Attending: Family Medicine | Admitting: Family Medicine

## 2019-02-14 DIAGNOSIS — Z1231 Encounter for screening mammogram for malignant neoplasm of breast: Secondary | ICD-10-CM | POA: Diagnosis not present

## 2019-02-21 MED FILL — OMEPRAZOLE 40 MG CPDR: 40 | 90 days supply | Qty: 90 | Fill #1

## 2019-03-28 ENCOUNTER — Other Ambulatory Visit: Payer: 59

## 2019-04-04 ENCOUNTER — Encounter: Payer: 59 | Admitting: Family Medicine

## 2019-06-30 ENCOUNTER — Telehealth: Payer: 59 | Admitting: Nurse Practitioner

## 2019-06-30 DIAGNOSIS — B370 Candidal stomatitis: Secondary | ICD-10-CM | POA: Diagnosis not present

## 2019-06-30 MED ORDER — NYSTATIN 100000 UNIT/ML MT SUSP: 5.0000 mL | Freq: Four times a day (QID) | OROMUCOSAL | 0 refills | Status: DC

## 2019-06-30 NOTE — Progress Notes (Signed)
I am sorry you are not feeling well. It sounds like you have Trush. Ritta Slot is a oral yeast infection.  Thrush (oropharyngeal candidiasis) is a medical condition in which a yeast-like fungus called Candida albicans overgrows in the mouth and throat. Ritta Slot may be triggered to occur by a variety of factors, including illness, pregnancy, medications, smoking, or dentures.  I have prescribed nystatin suspension that you should swish and swallow 4X a day.  Home remedies for oral candidiasis are aimed at decreasing risk factors for thrush as well as preventing overgrowth of Candida yeast. Brush the teeth with a soft toothbrush.  Rinse the mouth with a diluted 3% hydrogen peroxide solution.  Rinse the mouth with warm saltwater.  Avoid mouthwash as it can alter the normal flora of the mouth.  Keep dentures clean and see a dentist if they do not fit correctly.  Eat unsweetened yogurt while taking antibiotics.    Make sure you :  Understand these instructions.  Will watch your condition.  Will get help right away if you are not doing well or get worse.  Your e-visit answers were reviewed by a board certified advanced clinical practitioner to complete your personal care plan.  Depending on the condition, your plan could have included both over the counter or prescription medications.  If there is a problem please reply  once you have received a response from your provider.  Your safety is important to [place].  If you have drug allergies check your prescription carefully.    You can use MyChart to ask questions about today's visit, request a non-urgent call back, or ask for a work or school excuse for 24 hours related to this e-Visit. If it has been greater than 24 hours you will need to follow up with your provider, or enter a new e-Visit to address those concerns.  You will get an e-mail in the next two days asking about your experience.  I hope that your e-visit has been valuable and will speed  your recovery. Thank you for using e-visits.   5-10 minutes spent reviewing and documenting in chart.

## 2019-07-22 ENCOUNTER — Telehealth: Payer: Self-pay

## 2019-07-22 NOTE — Telephone Encounter (Signed)
Copied from [place] [id number]. Topic: Appointment Scheduling - Scheduling Inquiry for Clinic >> Jul 21, 2019  5:08 PM Yvette Rack wrote: Reason for CRM: Pt called to reschedule annual physical. Pt requests call back.

## 2019-07-24 ENCOUNTER — Telehealth: Payer: Self-pay

## 2019-07-24 ENCOUNTER — Other Ambulatory Visit: Payer: Self-pay

## 2019-07-24 MED ORDER — OMEPRAZOLE 40 MG PO CPDR: 40.0000 mg | DELAYED_RELEASE_CAPSULE | Freq: Every day | ORAL | 1 refills | Status: DC

## 2019-07-24 MED FILL — OMEPRAZOLE DR 40 MG CAPSULE: 40 | 90 days supply | Qty: 90 | Fill #0

## 2019-07-24 NOTE — Telephone Encounter (Signed)
Copied from [place] [id number]. Topic: General - Other >> Jul 24, 2019  3:15 PM Leward Quan A wrote: Reason for CRM: Patient called to say that Rx was sent to the wrong pharmacy need to be sent to [facility] out patient pharmacy omeprazole ([place]) 40 MG capsule asking for it to be re sent please.

## 2019-07-24 NOTE — Telephone Encounter (Signed)
Sent medications in to [facility]

## 2019-08-08 DIAGNOSIS — H5213 Myopia, bilateral: Secondary | ICD-10-CM | POA: Diagnosis not present

## 2019-08-14 ENCOUNTER — Other Ambulatory Visit: Payer: Self-pay

## 2019-08-14 DIAGNOSIS — R6889 Other general symptoms and signs: Secondary | ICD-10-CM | POA: Diagnosis not present

## 2019-08-14 DIAGNOSIS — Z20822 Contact with and (suspected) exposure to covid-19: Secondary | ICD-10-CM

## 2019-08-16 LAB — NOVEL CORONAVIRUS, NAA: SARS-CoV-2, NAA: NOT DETECTED

## 2019-09-06 ENCOUNTER — Encounter: Payer: Self-pay | Admitting: Family Medicine

## 2019-09-06 ENCOUNTER — Encounter: Payer: Self-pay | Admitting: Gynecology

## 2019-09-12 ENCOUNTER — Other Ambulatory Visit: Payer: Self-pay

## 2019-09-12 ENCOUNTER — Encounter: Payer: Self-pay | Admitting: Family Medicine

## 2019-09-12 ENCOUNTER — Ambulatory Visit (INDEPENDENT_AMBULATORY_CARE_PROVIDER_SITE_OTHER): Payer: 59 | Admitting: Family Medicine

## 2019-09-12 VITALS — BP 102/68 | HR 54 | Temp 97.1°F | Resp 18 | Wt 132.0 lb

## 2019-09-12 DIAGNOSIS — Z0000 Encounter for general adult medical examination without abnormal findings: Secondary | ICD-10-CM

## 2019-09-12 DIAGNOSIS — D72819 Decreased white blood cell count, unspecified: Secondary | ICD-10-CM

## 2019-09-12 DIAGNOSIS — Z1211 Encounter for screening for malignant neoplasm of colon: Secondary | ICD-10-CM

## 2019-09-12 DIAGNOSIS — K2100 Gastro-esophageal reflux disease with esophagitis, without bleeding: Secondary | ICD-10-CM

## 2019-09-12 DIAGNOSIS — E559 Vitamin D deficiency, unspecified: Secondary | ICD-10-CM | POA: Diagnosis not present

## 2019-09-12 DIAGNOSIS — R739 Hyperglycemia, unspecified: Secondary | ICD-10-CM | POA: Diagnosis not present

## 2019-09-12 LAB — COMPREHENSIVE METABOLIC PANEL
ALT: 11 U/L (ref 0–35)
AST: 14 U/L (ref 0–37)
Albumin: 4.3 g/dL (ref 3.5–5.2)
Alkaline Phosphatase: 36 U/L — ABNORMAL LOW (ref 39–117)
BUN: 24 mg/dL — ABNORMAL HIGH (ref 6–23)
CO2: 29 mEq/L (ref 19–32)
Calcium: 9.7 mg/dL (ref 8.4–10.5)
Chloride: 101 mEq/L (ref 96–112)
Creatinine, Ser: 0.85 mg/dL (ref 0.40–1.20)
GFR: 70.66 mL/min (ref >60.00)
Glucose, Bld: 84 mg/dL (ref 70–99)
Potassium: 4.7 mEq/L (ref 3.5–5.1)
Sodium: 136 mEq/L (ref 135–145)
Total Bilirubin: 0.4 mg/dL (ref 0.2–1.2)
Total Protein: 6.6 g/dL (ref 6.0–8.3)

## 2019-09-12 LAB — CBC
HCT: 40.6 % (ref 36.0–46.0)
Hemoglobin: 13.6 g/dL (ref 12.0–15.0)
MCHC: 33.5 g/dL (ref 30.0–36.0)
MCV: 96.5 fl (ref 78.0–100.0)
Platelets: 270 10*3/uL (ref 150.0–400.0)
RBC: 4.21 Mil/uL (ref 3.87–5.11)
RDW: 13.5 % (ref 11.5–15.5)
WBC: 4.3 10*3/uL (ref 4.0–10.5)

## 2019-09-12 LAB — LIPID PANEL
Cholesterol: 186 mg/dL (ref 0–200)
HDL: 75 mg/dL (ref >39.00)
LDL Cholesterol: 97 mg/dL (ref 0–99)
NonHDL: 110.72
Total CHOL/HDL Ratio: 2
Triglycerides: 71 mg/dL (ref 0.0–149.0)
VLDL: 14.2 mg/dL (ref 0.0–40.0)

## 2019-09-12 LAB — HEMOGLOBIN A1C: Hgb A1c MFr Bld: 5.5 % (ref 4.6–6.5)

## 2019-09-12 LAB — VITAMIN D 25 HYDROXY (VIT D DEFICIENCY, FRACTURES): VITD: 43.28 ng/mL (ref 30.00–100.00)

## 2019-09-12 LAB — TSH: TSH: 1.8 u[IU]/mL (ref 0.35–4.50)

## 2019-09-12 MED ORDER — PROBIOTIC DAILY PO CAPS: 1.0000 | ORAL_CAPSULE | Freq: Every day | ORAL | 11 refills | Status: DC

## 2019-09-12 NOTE — Assessment & Plan Note (Addendum)
Patient encouraged to maintain heart healthy diet, regular exercise, adequate sleep. Consider daily probiotics. Take medications as prescribed. Encouraged to get shingrix will proceed soon, referred to gastroenterology for screening colonoscopy. Labs ordered

## 2019-09-12 NOTE — Patient Instructions (Signed)
[email or web address] Preventive Care 50-50 Years Old, Female Preventive care refers to visits with your health care provider and lifestyle choices that can promote health and wellness. This includes:  A yearly physical exam. This may also be called an annual well check.  Regular dental visits and eye exams.  Immunizations.  Screening for certain conditions.  Healthy lifestyle choices, such as eating a healthy diet, getting regular exercise, not using drugs or products that contain nicotine and tobacco, and limiting alcohol use. What can I expect for my preventive care visit? Physical exam Your health care provider will check your:  Height and weight. This may be used to calculate body mass index (BMI), which tells if you are at a healthy weight.  Heart rate and blood pressure.  Skin for abnormal spots. Counseling Your health care provider may ask you questions about your:  Alcohol, tobacco, and drug use.  Emotional well-being.  Home and relationship well-being.  Sexual activity.  Eating habits.  Work and work Statistician.  Method of birth control.  Menstrual cycle.  Pregnancy history. What immunizations do I need?  Influenza (flu) vaccine  This is recommended every year. Tetanus, diphtheria, and pertussis (Tdap) vaccine  You may need a Td booster every 10 years. Varicella (chickenpox) vaccine  You may need this if you have not been vaccinated. Zoster (shingles) vaccine  You may need this after age 12. Measles, mumps, and rubella (MMR) vaccine  You may need at least one dose of MMR if you were born in 1957 or later. You may also need a second dose. Pneumococcal conjugate (PCV13) vaccine  You may need this if you have certain conditions and were not previously vaccinated. Pneumococcal polysaccharide (PPSV23) vaccine  You may need one or two doses if you smoke cigarettes or if you have certain conditions. Meningococcal conjugate (MenACWY) vaccine  You may  need this if you have certain conditions. Hepatitis A vaccine  You may need this if you have certain conditions or if you travel or work in places where you may be exposed to hepatitis A. Hepatitis B vaccine  You may need this if you have certain conditions or if you travel or work in places where you may be exposed to hepatitis B. Haemophilus influenzae type b (Hib) vaccine  You may need this if you have certain conditions. Human papillomavirus (HPV) vaccine  If recommended by your health care provider, you may need three doses over 6 months. You may receive vaccines as individual doses or as more than one vaccine together in one shot (combination vaccines). Talk with your health care provider about the risks and benefits of combination vaccines. What tests do I need? Blood tests  Lipid and cholesterol levels. These may be checked every 5 years, or more frequently if you are over 24 years old.  Hepatitis C test.  Hepatitis B test. Screening  Lung cancer screening. You may have this screening every year starting at age 54 if you have a 30-pack-year history of smoking and currently smoke or have quit within the past 15 years.  Colorectal cancer screening. All adults should have this screening starting at age 52 and continuing until age 10. Your health care provider may recommend screening at age 46 if you are at increased risk. You will have tests every 1-10 years, depending on your results and the type of screening test.  Diabetes screening. This is done by checking your blood sugar (glucose) after you have not eaten for a while (fasting). You may have  this done every 1-3 years.  Mammogram. This may be done every 1-2 years. Talk with your health care provider about when you should start having regular mammograms. This may depend on whether you have a family history of breast cancer.  BRCA-related cancer screening. This may be done if you have a family history of breast, ovarian,  tubal, or peritoneal cancers.  Pelvic exam and Pap test. This may be done every 3 years starting at age 48. Starting at age 41, this may be done every 5 years if you have a Pap test in combination with an HPV test. Other tests  Sexually transmitted disease (STD) testing.  Bone density scan. This is done to screen for osteoporosis. You may have this scan if you are at high risk for osteoporosis. Follow these instructions at home: Eating and drinking  Eat a diet that includes fresh fruits and vegetables, whole grains, lean protein, and low-fat dairy.  Take vitamin and mineral supplements as recommended by your health care provider.  Do not drink alcohol if: ? Your health care provider tells you not to drink. ? You are pregnant, may be pregnant, or are planning to become pregnant.  If you drink alcohol: ? Limit how much you have to 0-1 drink a day. ? Be aware of how much alcohol is in your drink. In the [place] one drink equals one 12 oz bottle of beer (355 mL), one 5 oz glass of wine (148 mL), or one 1 oz glass of hard liquor (44 mL). Lifestyle  Take daily care of your teeth and gums.  Stay active. Exercise for at least 30 minutes on 5 or more days each week.  Do not use any products that contain nicotine or tobacco, such as cigarettes, e-cigarettes, and chewing tobacco. If you need help quitting, ask your health care provider.  If you are sexually active, practice safe sex. Use a condom or other form of birth control (contraception) in order to prevent pregnancy and STIs (sexually transmitted infections).  If told by your health care provider, take low-dose aspirin daily starting at age 59. What's next?  Visit your health care provider once a year for a well check visit.  Ask your health care provider how often you should have your eyes and teeth checked.  Stay up to date on all vaccines. This information is not intended to replace advice given to you by your health care  provider. Make sure you discuss any questions you have with your health care provider. Document Released: 12/27/2015 Document Revised: 08/11/2018 Document Reviewed: 08/11/2018 Elsevier Patient Education  2020 [employer].

## 2019-09-12 NOTE — Progress Notes (Signed)
Subjective:    Patient ID: Valerie Jones, female    DOB: 03-22-1971, 48 y.o.   MRN: [id number]  No chief complaint on file.   HPI Patient is in today for annual preventative exam and follow up on chronic medical concerns including reflux, vitamin D deficiency and hyperglycemia. 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 Her son is at school at [employer] and his roommate just tested positive for COVID so he is getting tested today. Overall she is doing well but she is still having reflux at times and has stayed on her Omeprazole this year. No recent febrile illness or hospitalizations. She is trying to eat well and stay active. Denies CP/palp/SOB/HA/congestion/fevers/GI or GU c/o. Taking meds as prescribed. She continues to take Vitamin D 2000 IU daily.  Past Medical History:  Diagnosis Date   Abdominal pain 12/21/2016   Acid reflux 08/16/2017   Anxiety 03/24/2018   ASCUS of cervix with negative high risk HPV 03/2017   GERD (gastroesophageal reflux disease)    H/O atopic dermatitis 03/17/2015   Hemorrhoids    History of chicken pox    Preventative health care 12/21/2016   Urinary incontinence 12/21/2016   Vitamin D deficiency 12/21/2016    Past Surgical History:  Procedure Laterality Date   APPENDECTOMY  1988   INTRAUTERINE DEVICE INSERTION  11/13/2016   MIRENA    Family History  Problem Relation Age of Onset   Hypertension Father    Stroke Father    Atrial fibrillation Father    Dementia Father    Cancer Maternal Grandfather        Non Hodgkins Lymphoma   Hypertension Sister    Other Sister        prediabetes   Breast cancer Sister 74   Cancer Sister        breast   Mental illness Mother        anxiety   Irritable bowel syndrome Mother    Dementia Mother    COPD Maternal Grandmother        smoker    Social History   Socioeconomic History   Marital status: Married    Spouse name: Not on file   Number of  children: Not on file   Years of education: Not on file   Highest education level: Not on file  Occupational History   Not on file  Social Needs   Financial resource strain: Not on file   Food insecurity    Worry: Not on file    Inability: Not on file   Transportation needs    Medical: Not on file    Non-medical: Not on file  Tobacco Use   Smoking status: Never Smoker   Smokeless tobacco: Never Used  Substance and Sexual Activity   Alcohol use: Yes    Alcohol/week: 0.0 standard drinks    Comment: 2 NIGHTS A WEEK ... WINE   Drug use: No   Sexual activity: Yes    Birth control/protection: I.U.D.    Comment: Mirena inserted 11/13/2016,,Pt declined sexual Hx   Lifestyle   Physical activity    Days per week: Not on file    Minutes per session: Not on file   Stress: Not on file  Relationships   Social connections    Talks on phone: Not on file    Gets together: Not on file    Attends religious service: Not on file    Active member of club  or organization: Not on file    Attends meetings of clubs or organizations: Not on file    Relationship status: Not on file   Intimate partner violence    Fear of current or ex partner: Not on file    Emotionally abused: Not on file    Physically abused: Not on file    Forced sexual activity: Not on file  Other Topics Concern   Not on file  Social History Narrative   Not on file    Outpatient Medications Prior to Visit  Medication Sig Dispense Refill   ibuprofen (ADVIL,MOTRIN) 200 MG tablet Take 200 mg by mouth every 6 (six) hours as needed.     levonorgestrel (MIRENA) 20 MCG/24HR IUD 1 each by Intrauterine route once.     nystatin (MYCOSTATIN) 100000 UNIT/ML suspension Take 5 mLs (500,000 Units total) by mouth 4 (four) times daily. 60 mL 0   omeprazole (PRILOSEC) 40 MG capsule Take 1 capsule (40 mg total) by mouth daily. 90 capsule 1   Phenylephrine-Chlorphen-DM (ROBITUSSIN COUGH/ALLERGY PO) Take by mouth.      No facility-administered medications prior to visit.     Allergies  Allergen Reactions   Clindamycin/Lincomycin Hives   Sulfa Antibiotics Hives    Review of Systems  Constitutional: Negative for chills, fever and malaise/fatigue.  HENT: Negative for congestion and hearing loss.   Eyes: Negative for discharge.  Respiratory: Negative for cough, sputum production and shortness of breath.   Cardiovascular: Negative for chest pain, palpitations and leg swelling.  Gastrointestinal: Positive for heartburn. Negative for abdominal pain, blood in stool, constipation, diarrhea, nausea and vomiting.  Genitourinary: Negative for dysuria, frequency, hematuria and urgency.  Musculoskeletal: Negative for back pain, falls and myalgias.  Skin: Negative for rash.  Neurological: Negative for dizziness, sensory change, loss of consciousness, weakness and headaches.  Endo/Heme/Allergies: Negative for environmental allergies. Does not bruise/bleed easily.  Psychiatric/Behavioral: Negative for depression and suicidal ideas. The patient is not nervous/anxious and does not have insomnia.        Objective:    Physical Exam Constitutional:      General: She is not in acute distress.    Appearance: She is well-developed.  HENT:     Head: Normocephalic and atraumatic.  Eyes:     Conjunctiva/sclera: Conjunctivae normal.  Neck:     Musculoskeletal: Neck supple.     Thyroid: No thyromegaly.  Cardiovascular:     Rate and Rhythm: Normal rate and regular rhythm.     Heart sounds: Normal heart sounds. No murmur.  Pulmonary:     Effort: Pulmonary effort is normal. No respiratory distress.     Breath sounds: Normal breath sounds.  Abdominal:     General: Bowel sounds are normal. There is no distension.     Palpations: Abdomen is soft. There is no mass.     Tenderness: There is no abdominal tenderness.  Lymphadenopathy:     Cervical: No cervical adenopathy.  Skin:    General: Skin is warm and dry.   Neurological:     Mental Status: She is alert and oriented to person, place, and time.  Psychiatric:        Behavior: Behavior normal.     BP 102/68 (BP Location: Left Arm, Patient Position: Sitting, Cuff Size: Normal)    Pulse (!) 54    Temp (!) 97.1 F (36.2 C) (Temporal)    Resp 18    Wt 132 lb (59.9 kg)    SpO2 98%  BMI 22.66 kg/m  Wt Readings from Last 3 Encounters:  09/12/19 132 lb (59.9 kg)  01/16/19 133 lb 3.2 oz (60.4 kg)  08/06/18 131 lb 9.6 oz (59.7 kg)    Diabetic Foot Exam - Simple   No data filed     Lab Results  Component Value Date   WBC 4.0 06/23/2018   HGB 12.3 06/23/2018   HCT 37.0 06/23/2018   PLT 276.0 06/23/2018   GLUCOSE 92 06/23/2018   CHOL 178 03/14/2018   TRIG 81.0 03/14/2018   HDL 66.70 03/14/2018   LDLCALC 95 03/14/2018   ALT 14 06/23/2018   AST 14 06/23/2018   NA 139 06/23/2018   K 4.3 06/23/2018   CL 104 06/23/2018   CREATININE 0.81 06/23/2018   BUN 10 06/23/2018   CO2 29 06/23/2018   TSH 2.42 03/14/2018   HGBA1C 5.3 06/23/2018    Lab Results  Component Value Date   TSH 2.42 03/14/2018   Lab Results  Component Value Date   WBC 4.0 06/23/2018   HGB 12.3 06/23/2018   HCT 37.0 06/23/2018   MCV 96.4 06/23/2018   PLT 276.0 06/23/2018   Lab Results  Component Value Date   NA 139 06/23/2018   K 4.3 06/23/2018   CO2 29 06/23/2018   GLUCOSE 92 06/23/2018   BUN 10 06/23/2018   CREATININE 0.81 06/23/2018   BILITOT 0.5 06/23/2018   ALKPHOS 32 (L) 06/23/2018   AST 14 06/23/2018   ALT 14 06/23/2018   PROT 6.1 06/23/2018   ALBUMIN 4.0 06/23/2018   CALCIUM 8.9 06/23/2018   GFR 79.79 06/23/2018   Lab Results  Component Value Date   CHOL 178 03/14/2018   Lab Results  Component Value Date   HDL 66.70 03/14/2018   Lab Results  Component Value Date   LDLCALC 95 03/14/2018   Lab Results  Component Value Date   TRIG 81.0 03/14/2018   Lab Results  Component Value Date   CHOLHDL 3 03/14/2018   Lab Results  Component  Value Date   HGBA1C 5.3 06/23/2018       Assessment & Plan:   Problem List Items Addressed This Visit    Preventative health care - Primary    Patient encouraged to maintain heart healthy diet, regular exercise, adequate sleep. Consider daily probiotics. Take medications as prescribed. Encouraged to get shingrix will proceed soon, referred to gastroenterology for screening colonoscopy. Labs ordered      Relevant Orders   Hemoglobin A1c   CBC   Comprehensive metabolic panel   Lipid panel   TSH   VITAMIN D 25 Hydroxy (Vit-D Deficiency, Fractures)   Vitamin D deficiency    Supplement and monitor      Relevant Orders   VITAMIN D 25 Hydroxy (Vit-D Deficiency, Fractures)   Acid reflux    Continues on Omeprazole daily encouraged to try alternating Omeprazole with Famotidine every other day and see how that works. Avoid offending foods, start probiotics. Do not eat large meals in late evening and consider raising head of bed. Given prescription for NOW probiotic daily      Relevant Medications   Probiotic Product (PROBIOTIC DAILY) CAPS   Hyperglycemia     minimize simple carbs. Increase exercise as tolerated. Continue current meds      Relevant Orders   Hemoglobin A1c   CBC   Comprehensive metabolic panel   Leukopenia   Relevant Orders   CBC   Comprehensive metabolic panel    Other Visit Diagnoses  Colon cancer screening       Relevant Orders   Ambulatory referral to Gastroenterology      I am having Mitra L. Bogus start on Probiotic Daily. I am also having her maintain her levonorgestrel, ibuprofen, Phenylephrine-Chlorphen-DM (ROBITUSSIN COUGH/ALLERGY PO), nystatin, and omeprazole.  Meds ordered this encounter  Medications   Probiotic Product (PROBIOTIC DAILY) CAPS    Sig: Take 1 capsule by mouth daily. [employer] company    Dispense:  30 capsule    Refill:  11     Penni Homans, MD

## 2019-09-12 NOTE — Assessment & Plan Note (Signed)
Supplement and monitor 

## 2019-09-12 NOTE — Assessment & Plan Note (Addendum)
Continues on Omeprazole daily encouraged to try alternating Omeprazole with Famotidine every other day and see how that works. Avoid offending foods, start probiotics. Do not eat large meals in late evening and consider raising head of bed. Given prescription for NOW probiotic daily

## 2019-09-12 NOTE — Assessment & Plan Note (Signed)
minimize simple carbs. Increase exercise as tolerated. Continue current meds  

## 2019-09-28 ENCOUNTER — Encounter: Payer: 59 | Admitting: Family Medicine

## 2019-12-06 ENCOUNTER — Other Ambulatory Visit: Payer: Self-pay | Admitting: Family Medicine

## 2019-12-06 ENCOUNTER — Encounter: Payer: Self-pay | Admitting: Family Medicine

## 2019-12-06 ENCOUNTER — Telehealth: Payer: Self-pay

## 2019-12-06 DIAGNOSIS — H539 Unspecified visual disturbance: Secondary | ICD-10-CM

## 2019-12-06 NOTE — Telephone Encounter (Signed)
I called pt again to make new pt appt. Pt was schedule 12/12/2019 that was approve by Dr.Sethi. PT verbalized and confirmed appt.

## 2019-12-06 NOTE — Telephone Encounter (Signed)
IF patient calls back please schedule her at 430 on 12/12/2019 per Dr. Leonie Man for new pt appt for vision disturbance. I tried to call pt but her vm was fiull and I was unable to leave message.

## 2019-12-06 NOTE — Progress Notes (Unsigned)
amb  

## 2019-12-12 ENCOUNTER — Ambulatory Visit (INDEPENDENT_AMBULATORY_CARE_PROVIDER_SITE_OTHER): Payer: 59 | Admitting: Neurology

## 2019-12-12 ENCOUNTER — Encounter: Payer: Self-pay | Admitting: Neurology

## 2019-12-12 ENCOUNTER — Other Ambulatory Visit: Payer: Self-pay

## 2019-12-12 VITALS — BP 104/70 | HR 47 | Temp 97.0°F | Ht 64.0 in | Wt 134.6 lb

## 2019-12-12 DIAGNOSIS — H539 Unspecified visual disturbance: Secondary | ICD-10-CM | POA: Diagnosis not present

## 2019-12-12 DIAGNOSIS — G459 Transient cerebral ischemic attack, unspecified: Secondary | ICD-10-CM | POA: Diagnosis not present

## 2019-12-12 MED ORDER — ASPIRIN EC 81 MG PO TBEC: 81.0000 mg | DELAYED_RELEASE_TABLET | Freq: Every day | ORAL | 2 refills | Status: DC

## 2019-12-12 NOTE — Patient Instructions (Signed)
I had a long discussion with the patient regarding her transient episode of right hemifacial vision disturbance possibly representing a posterior circulation TIA versus episode of complicated migraine.  She does not have typical vascular risk factors except for borderline lipids.  Recommend she start taking enteric-coated aspirin 81 mg daily and check MRI scan of the brain, MRA of the brain and neck and transcranial Doppler bubble study for PFO.  She was encouraged to eat healthy diet and be active and exercise regularly.  She will return for follow-up in the future in 2 months or call earlier if necessary

## 2019-12-12 NOTE — Progress Notes (Signed)
[facility] [address] [place]. [place] [address] [phone]       OFFICE CONSULT NOTE  Ms. Valerie Jones Date of Birth:  04/03/1971 Medical Record Number:  [id number]   Referring MD: Willette Alma  Reason for Referral: Vision disturbance  HPI: Ms. Valerie Jones is a pleasant 48 year old Caucasian lady with past medical history of only gastroesophageal reflux disease who is seen today for initial office consultation visit for episode of possible TIA.  History is obtained from the patient, review of electronic medical records.  She had transient episode of right hemifield loss of vision 2 and half weeks ago.  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  She stated that when she was in the grocery store and looking at the lady she could not see her right heart.  She moves her head from left to right and was clearly able to see better.  She had a mild dull headache but denied seeing any stars or flashing lights.  Episode cleared within 10 to 15 minutes.  She was subsequently able to drive safely.  This has not happened again since then.  She has no prior history of migraine headaches episodes of visual disturbances.  There is no prior history of strokes TIAs seizures or significant neurological problems.  She has no prior history of DVTs, pulmonary embolism or recurrent miscarriages.  She does not have significant vascular risk factors except lipid profile on 09/12/2019 showed total cholesterol 186, triglycerides 71, HDL 75 and LDL 97 mg percent.  Hemoglobin A1c was 5.5.  She has no complaints today ROS:   14 system review of systems is positive for vision loss, vision disturbance, headache and all other systems negative  PMH:  Past Medical History:  Diagnosis Date  . Abdominal pain 12/21/2016  . Acid reflux 08/16/2017  . Anxiety 03/24/2018  . ASCUS of cervix with negative high  risk HPV 03/2017  . GERD (gastroesophageal reflux disease)   . H/O atopic dermatitis 03/17/2015  . Hemorrhoids   . History of chicken pox   . Preventative health care 12/21/2016  . Urinary incontinence 12/21/2016  . Vitamin D deficiency 12/21/2016    Social History:  Social History   Socioeconomic History  . Marital status: Married    Spouse name: Not on file  . Number of children: Not on file  . Years of education: Not on file  . Highest education level: Not on file  Occupational History  . Not on file  Tobacco Use  . Smoking status: Never Smoker  . Smokeless tobacco: Never Used  Substance and Sexual Activity  . Alcohol use: Yes    Alcohol/week: 0.0 standard drinks    Comment: 2 NIGHTS A WEEK ... WINE  . Drug use: No  . Sexual activity: Yes    Birth control/protection: I.U.D.    Comment: Mirena inserted 11/13/2016,,Pt declined sexual Hx   Other Topics Concern  . Not on file  Social History Narrative  . Not on file   Social Determinants of Health   Financial Resource Strain:   . Difficulty of Paying Living Expenses: Not on file  Food Insecurity:   . Worried About Charity fundraiser in the Last Year: Not on file  . Ran Out of Food in the Last Year: Not on file  Transportation Needs:   . Lack of Transportation (Medical): Not  on file  . Lack of Transportation (Non-Medical): Not on file  Physical Activity:   . Days of Exercise per Week: Not on file  . Minutes of Exercise per Session: Not on file  Stress:   . Feeling of Stress : Not on file  Social Connections:   . Frequency of Communication with Friends and Family: Not on file  . Frequency of Social Gatherings with Friends and Family: Not on file  . Attends Religious Services: Not on file  . Active Member of Clubs or Organizations: Not on file  . Attends Archivist Meetings: Not on file  . Marital Status: Not on file  Intimate Partner Violence:   . Fear of Current or Ex-Partner: Not on file  . Emotionally  Abused: Not on file  . Physically Abused: Not on file  . Sexually Abused: Not on file    Medications:   Current Outpatient Medications on File Prior to Visit  Medication Sig Dispense Refill  . levonorgestrel (MIRENA) 20 MCG/24HR IUD 1 each by Intrauterine route once.    Marland Kitchen omeprazole (PRILOSEC) 40 MG capsule Take 1 capsule (40 mg total) by mouth daily. 90 capsule 1  . Probiotic Product (PROBIOTIC DAILY) CAPS Take 1 capsule by mouth daily. [place] 30 capsule 11  . VITAMIN D PO Take 1,000 units of lipase by mouth.     No current facility-administered medications on file prior to visit.    Allergies:   Allergies  Allergen Reactions  . Clindamycin/Lincomycin Hives  . Sulfa Antibiotics Hives    Physical Exam General: well developed, well nourished middle-aged Caucasian lady, seated, in no evident distress Head: head normocephalic and atraumatic.   Neck: supple with no carotid or supraclavicular bruits Cardiovascular: regular rate and rhythm, no murmurs Musculoskeletal: no deformity Skin:  no rash/petichiae Vascular:  Normal pulses all extremities  Neurologic Exam Mental Status: Awake and fully alert. Oriented to place and time. Recent and remote memory intact. Attention span, concentration and fund of knowledge appropriate. Mood and affect appropriate.  Cranial Nerves: Fundoscopic exam reveals sharp disc margins. Pupils equal, briskly reactive to light. Extraocular movements full without nystagmus. Visual fields full to confrontation. Hearing intact. Facial sensation intact. Face, tongue, palate moves normally and symmetrically.  Motor: Normal bulk and tone. Normal strength in all tested extremity muscles. Sensory.: intact to touch , pinprick , position and vibratory sensation.  Coordination: Rapid alternating movements normal in all extremities. Finger-to-nose and heel-to-shin performed accurately bilaterally. Gait and Station: Arises from chair without difficulty. Stance is  normal. Gait demonstrates normal stride length and balance . Able to heel, toe and tandem walk without difficulty.  Reflexes: 1+ and symmetric. Toes downgoing.   NIHSS  0 Modified Rankin  0   ASSESSMENT: 48 year old Caucasian lady with transient episode of right visual hemifield loss possible TIA versus complicated migraine episode.  No significant vascular risk factors except borderline hyperlipidemia.     PLAN: I had a long discussion with the patient regarding her transient episode of right hemifacial vision disturbance possibly representing a posterior circulation TIA versus episode of complicated migraine.  She does not have typical vascular risk factors except for borderline lipids.  Recommend she start taking enteric-coated aspirin 81 mg daily and check MRI scan of the brain, MRA of the brain and neck and transcranial Doppler bubble study for PFO.  She was encouraged to eat healthy diet and be active and exercise regularly.  Greater than 50% time during this 45-minute consultation visit was spent  on counseling and coordination of care about her episode of transient vision disturbance discussion about TIA versus complicated migraine and answering questions.  She will return for follow-up in the future in 2 months or call earlier if necessary Antony Contras, MD  [facility] [address] [place], [place] [address]  Phone [phone] Fax [phone]  Note: This document was prepared with digital dictation and possible smart phrase technology. Any transcriptional errors that result from this process are unintentional.

## 2019-12-20 ENCOUNTER — Other Ambulatory Visit: Payer: Self-pay

## 2019-12-20 ENCOUNTER — Ambulatory Visit: Payer: 59

## 2019-12-20 DIAGNOSIS — G459 Transient cerebral ischemic attack, unspecified: Secondary | ICD-10-CM

## 2019-12-20 MED ORDER — GADOBENATE DIMEGLUMINE 529 MG/ML IV SOLN
15.0000 mL | Freq: Once | INTRAVENOUS | Status: AC | PRN
  Administered 2019-12-20: 09:00:00 15 mL via INTRAVENOUS

## 2019-12-25 ENCOUNTER — Ambulatory Visit: Payer: Self-pay | Admitting: Neurology

## 2019-12-26 ENCOUNTER — Ambulatory Visit (HOSPITAL_COMMUNITY)
Admission: RE | Admit: 2019-12-26 | Discharge: 2019-12-26 | Disposition: A | Discharge status: Home / Self Care | Payer: 59 | Source: Ambulatory Visit | Attending: Neurology | Admitting: Neurology

## 2019-12-26 ENCOUNTER — Other Ambulatory Visit: Payer: Self-pay

## 2019-12-26 DIAGNOSIS — H539 Unspecified visual disturbance: Secondary | ICD-10-CM | POA: Insufficient documentation

## 2019-12-26 DIAGNOSIS — G459 Transient cerebral ischemic attack, unspecified: Secondary | ICD-10-CM | POA: Insufficient documentation

## 2019-12-26 NOTE — Progress Notes (Signed)
TCD bubble study completed with Dr. Erlinda Hong. Refer to "CV Proc" under chart review to view preliminary results.  12/26/2019 3:48 PM Kelby Aline., MHA, RVT, RDCS, RDMS

## 2019-12-27 ENCOUNTER — Other Ambulatory Visit: Payer: Self-pay | Admitting: Family Medicine

## 2019-12-27 NOTE — Progress Notes (Signed)
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  Nothing to worry about at this time.

## 2020-01-16 ENCOUNTER — Other Ambulatory Visit: Payer: Self-pay | Admitting: Family Medicine

## 2020-01-16 DIAGNOSIS — Z1231 Encounter for screening mammogram for malignant neoplasm of breast: Secondary | ICD-10-CM

## 2020-01-29 ENCOUNTER — Encounter: Payer: Self-pay | Admitting: Family Medicine

## 2020-02-13 ENCOUNTER — Encounter: Payer: Self-pay | Admitting: Gastroenterology

## 2020-02-16 ENCOUNTER — Other Ambulatory Visit: Payer: Self-pay

## 2020-02-16 ENCOUNTER — Encounter: Payer: Self-pay | Admitting: Family Medicine

## 2020-02-16 MED ORDER — OMEPRAZOLE 40 MG PO CPDR: 40.0000 mg | DELAYED_RELEASE_CAPSULE | Freq: Every day | ORAL | 1 refills | Status: DC

## 2020-02-19 ENCOUNTER — Ambulatory Visit (AMBULATORY_SURGERY_CENTER): Payer: Self-pay | Admitting: *Deleted

## 2020-02-19 ENCOUNTER — Other Ambulatory Visit: Payer: Self-pay

## 2020-02-19 VITALS — Temp 97.7°F | Ht 64.0 in | Wt 134.0 lb

## 2020-02-19 DIAGNOSIS — Z1211 Encounter for screening for malignant neoplasm of colon: Secondary | ICD-10-CM

## 2020-02-19 MED ORDER — NA SULFATE-K SULFATE-MG SULF 17.5-3.13-1.6 GM/177ML PO SOLN: ORAL | 0 refills | Status: DC

## 2020-02-19 MED FILL — SUPREP BOWEL PREP KIT: 17.5-3.13-1 | 1 days supply | Qty: 354 | Fill #0

## 2020-02-19 NOTE — Progress Notes (Signed)
Patient is here in-person for PV. Patient denies any allergies to eggs or soy. Patient denies any problems with anesthesia/sedation. Patient denies any oxygen use at home. Patient denies taking any diet/weight loss medications or blood thinners. Patient is not being treated for MRSA or C-diff. EMMI education assisgned to the patient for the procedure, this was explained and instructions given to patient. COVID-19 screening not needed, pt has had both vaccines in Jan. 2021.  Patient is aware of our care-partner policy and will wear a mask into building.

## 2020-02-20 ENCOUNTER — Ambulatory Visit
Admission: RE | Admit: 2020-02-20 | Discharge: 2020-02-20 | Disposition: A | Discharge status: Home / Self Care | Payer: 59 | Source: Ambulatory Visit | Attending: Family Medicine | Admitting: Family Medicine

## 2020-02-20 ENCOUNTER — Other Ambulatory Visit: Payer: Self-pay | Admitting: Family Medicine

## 2020-02-20 DIAGNOSIS — Z1231 Encounter for screening mammogram for malignant neoplasm of breast: Secondary | ICD-10-CM

## 2020-02-21 MED FILL — OMEPRAZOLE 40 MG CPDR: 40 | 90 days supply | Qty: 90 | Fill #0

## 2020-02-21 NOTE — Telephone Encounter (Signed)
Spoke with Becky at [facility] and cancelled refill from 02/16/20 that was placed on hold and not picked up yet. Rx sentt o [place] pharmacy.

## 2020-03-01 ENCOUNTER — Encounter: Payer: Self-pay | Admitting: Gastroenterology

## 2020-03-06 ENCOUNTER — Ambulatory Visit (AMBULATORY_SURGERY_CENTER): Payer: 59 | Admitting: Gastroenterology

## 2020-03-06 ENCOUNTER — Other Ambulatory Visit: Payer: Self-pay

## 2020-03-06 ENCOUNTER — Encounter: Payer: Self-pay | Admitting: Gastroenterology

## 2020-03-06 VITALS — BP 96/49 | HR 47 | Temp 96.2°F | Resp 11 | Ht 64.0 in | Wt 134.0 lb

## 2020-03-06 DIAGNOSIS — K635 Polyp of colon: Secondary | ICD-10-CM | POA: Diagnosis not present

## 2020-03-06 DIAGNOSIS — Z1211 Encounter for screening for malignant neoplasm of colon: Secondary | ICD-10-CM | POA: Diagnosis not present

## 2020-03-06 DIAGNOSIS — D125 Benign neoplasm of sigmoid colon: Secondary | ICD-10-CM

## 2020-03-06 DIAGNOSIS — D122 Benign neoplasm of ascending colon: Secondary | ICD-10-CM

## 2020-03-06 HISTORY — PX: COLONOSCOPY: SHX174

## 2020-03-06 MED ORDER — SODIUM CHLORIDE 0.9 % IV SOLN: 500.0000 mL | Freq: Once | INTRAVENOUS | Status: DC

## 2020-03-06 NOTE — Patient Instructions (Signed)
° °Handouts on polyps & hemorrhoids given to you today  ° °Await pathology results on polyps removed  ° ° ° °YOU HAD AN ENDOSCOPIC PROCEDURE TODAY AT THE [facility]:   Refer to the procedure report that was given to you for any specific questions about what was found during the examination.  If the procedure report does not answer your questions, please call your gastroenterologist to clarify.  If you requested that your care partner not be given the details of your procedure findings, then the procedure report has been included in a sealed envelope for you to review at your convenience later. ° °YOU SHOULD EXPECT: Some feelings of bloating in the abdomen. Passage of more gas than usual.  Walking can help get rid of the air that was put into your GI tract during the procedure and reduce the bloating. If you had a lower endoscopy (such as a colonoscopy or flexible sigmoidoscopy) you may notice spotting of blood in your stool or on the toilet paper. If you underwent a bowel prep for your procedure, you may not have a normal bowel movement for a few days. ° °Please Note:  You might notice some irritation and congestion in your nose or some drainage.  This is from the oxygen used during your procedure.  There is no need for concern and it should clear up in a day or so. ° °SYMPTOMS TO REPORT IMMEDIATELY: ° °Following lower endoscopy (colonoscopy or flexible sigmoidoscopy): ° Excessive amounts of blood in the stool ° Significant tenderness or worsening of abdominal pains ° Swelling of the abdomen that is new, acute ° Fever of 100°F or higher ° ° °For urgent or emergent issues, a gastroenterologist can be reached at any hour by calling [phone]. °Do not use MyChart messaging for urgent concerns.  ° ° °DIET:  We do recommend a small meal at first, but then you may proceed to your regular diet.  Drink plenty of fluids but you should avoid alcoholic beverages for 24 hours. ° °ACTIVITY:  You should plan  to take it easy for the rest of today and you should NOT DRIVE or use heavy machinery until tomorrow (because of the sedation medicines used during the test).   ° °FOLLOW UP: °Our staff will call the number listed on your records 48-72 hours following your procedure to check on you and address any questions or concerns that you may have regarding the information given to you following your procedure. If we do not reach you, we will leave a message.  We will attempt to reach you two times.  During this call, we will ask if you have developed any symptoms of COVID 19. If you develop any symptoms (ie: fever, flu-like symptoms, shortness of breath, cough etc.) before then, please call [phone].  If you test positive for Covid 19 in the 2 weeks post procedure, please call and report this information to us.   ° °If any biopsies were taken you will be contacted by phone or by letter within the next 1-3 weeks.  Please call us at [phone] if you have not heard about the biopsies in 3 weeks.  ° ° °SIGNATURES/CONFIDENTIALITY: °You and/or your care partner have signed paperwork which will be entered into your electronic medical record.  These signatures attest to the fact that that the information above on your After Visit Summary has been reviewed and is understood.  Full responsibility of the confidentiality of this discharge information lies with you   with you and/or your care-partner.

## 2020-03-06 NOTE — Progress Notes (Signed)
To PACU, VSS. Report to [email or web address] 

## 2020-03-06 NOTE — Op Note (Signed)
[facility] Patient Name: Valerie Jones Procedure Date: 03/06/2020 2:17 PM MRN: [id number] Endoscopist: Mauri Pole , MD Age: 49 Referring MD:  Date of Birth: 09/25/1971 Gender: Female Account #: [id number] Procedure:                Colonoscopy Indications:              Screening for colorectal malignant neoplasm Medicines:                Monitored Anesthesia Care Procedure:                Pre-Anesthesia Assessment:                           - Prior to the procedure, a History and Physical                            was performed, and patient medications and                            allergies were reviewed. The patient's tolerance of                            previous anesthesia was also reviewed. The risks                            and benefits of the procedure and the sedation                            options and risks were discussed with the patient.                            All questions were answered, and informed consent                            was obtained. Prior Anticoagulants: The patient has                            taken no previous anticoagulant or antiplatelet                            agents. ASA Grade Assessment: II - A patient with                            mild systemic disease. After reviewing the risks                            and benefits, the patient was deemed in                            satisfactory condition to undergo the procedure.                           After obtaining informed consent, the colonoscope  was passed under direct vision. Throughout the                            procedure, the patient's blood pressure, pulse, and                            oxygen saturations were monitored continuously. The                            Colonoscope was introduced through the anus and                            advanced to the the cecum, identified by                            appendiceal orifice  and ileocecal valve. The                            colonoscopy was performed without difficulty. The                            patient tolerated the procedure well. The quality                            of the bowel preparation was excellent. The                            ileocecal valve, appendiceal orifice, and rectum                            were photographed. Scope In: 2:24:03 PM Scope Out: 2:40:53 PM Scope Withdrawal Time: 0 hours 10 minutes 20 seconds  Total Procedure Duration: 0 hours 16 minutes 50 seconds  Findings:                 The perianal and digital rectal examinations were                            normal.                           Two sessile polyps were found in the sigmoid colon                            and ascending colon. The polyps were 5 to 10 mm in                            size. These polyps were removed with a cold snare.                            Resection and retrieval were complete.                           Non-bleeding internal hemorrhoids were found during  retroflexion. The hemorrhoids were small.                           The exam was otherwise without abnormality. Complications:            No immediate complications. Estimated Blood Loss:     Estimated blood loss was minimal. Impression:               - Two 5 to 10 mm polyps in the sigmoid colon and in                            the ascending colon, removed with a cold snare.                            Resected and retrieved.                           - Non-bleeding internal hemorrhoids.                           - The examination was otherwise normal. Recommendation:           - Patient has a contact number available for                            emergencies. The signs and symptoms of potential                            delayed complications were discussed with the                            patient. Return to normal activities tomorrow.                             Written discharge instructions were provided to the                            patient.                           - Resume previous diet.                           - Continue present medications.                           - Await pathology results.                           - Repeat colonoscopy in 3 - 5 years for                            surveillance based on pathology results. Mauri Pole, MD 03/06/2020 2:45:28 PM This report has been signed electronically.

## 2020-03-06 NOTE — Progress Notes (Signed)
Called to room to assist during endoscopic procedure.  Patient ID and intended procedure confirmed with present staff. Received instructions for my participation in the procedure from the performing physician.  

## 2020-03-06 NOTE — Progress Notes (Signed)
Pt's states no medical or surgical changes since previsit or office visit.  BC IV, AF temp and CW vitals.

## 2020-03-08 ENCOUNTER — Telehealth: Payer: Self-pay | Admitting: *Deleted

## 2020-03-08 NOTE — Telephone Encounter (Signed)
Left message on 2nd f/u call 

## 2020-03-08 NOTE — Telephone Encounter (Signed)
Left message on f/u call 

## 2020-03-18 ENCOUNTER — Encounter: Payer: Self-pay | Admitting: Gastroenterology

## 2020-05-22 DIAGNOSIS — T1511XA Foreign body in conjunctival sac, right eye, initial encounter: Secondary | ICD-10-CM | POA: Diagnosis not present

## 2020-05-27 MED FILL — OMEPRAZOLE 40 MG CPDR: 40 | 90 days supply | Qty: 90 | Fill #1

## 2020-08-13 DIAGNOSIS — H5213 Myopia, bilateral: Secondary | ICD-10-CM | POA: Diagnosis not present

## 2020-08-14 DIAGNOSIS — F3289 Other specified depressive episodes: Secondary | ICD-10-CM | POA: Diagnosis not present

## 2020-08-21 DIAGNOSIS — F3289 Other specified depressive episodes: Secondary | ICD-10-CM | POA: Diagnosis not present

## 2020-09-17 ENCOUNTER — Encounter: Payer: Self-pay | Admitting: Family Medicine

## 2020-09-17 ENCOUNTER — Other Ambulatory Visit: Payer: Self-pay | Admitting: Family Medicine

## 2020-09-17 MED ORDER — OMEPRAZOLE 40 MG PO CPDR: 40.0000 mg | DELAYED_RELEASE_CAPSULE | Freq: Every day | ORAL | 1 refills | Status: DC

## 2020-09-17 MED FILL — OMEPRAZOLE 40 MG CPDR: 40 | 90 days supply | Qty: 90 | Fill #0

## 2020-09-18 DIAGNOSIS — F3289 Other specified depressive episodes: Secondary | ICD-10-CM | POA: Diagnosis not present

## 2020-09-26 DIAGNOSIS — F3289 Other specified depressive episodes: Secondary | ICD-10-CM | POA: Diagnosis not present

## 2020-10-02 DIAGNOSIS — F3289 Other specified depressive episodes: Secondary | ICD-10-CM | POA: Diagnosis not present

## 2020-10-24 DIAGNOSIS — F3289 Other specified depressive episodes: Secondary | ICD-10-CM | POA: Diagnosis not present

## 2020-10-29 ENCOUNTER — Encounter: Payer: Self-pay | Admitting: Family

## 2020-10-29 ENCOUNTER — Other Ambulatory Visit: Payer: Self-pay

## 2020-10-29 ENCOUNTER — Ambulatory Visit (INDEPENDENT_AMBULATORY_CARE_PROVIDER_SITE_OTHER): Payer: 59 | Admitting: Family

## 2020-10-29 VITALS — BP 97/54 | HR 51 | Temp 98.6°F | Resp 16 | Ht 64.0 in | Wt 131.0 lb

## 2020-10-29 DIAGNOSIS — R1013 Epigastric pain: Secondary | ICD-10-CM

## 2020-10-29 DIAGNOSIS — Z23 Encounter for immunization: Secondary | ICD-10-CM

## 2020-10-29 DIAGNOSIS — Z0000 Encounter for general adult medical examination without abnormal findings: Secondary | ICD-10-CM | POA: Diagnosis not present

## 2020-10-29 NOTE — Patient Instructions (Signed)
Please complete lab work prior to leaving. Continue healthy diet and regular exercise. Look into insurance coverage for Shingrix. You can either get from the pharmacy or we can give it to you in our office. Consider a Covid booster.   Preventive Care 51-51 Years Old, Female Preventive care refers to visits with your health care provider and lifestyle choices that can promote health and wellness. This includes:  A yearly physical exam. This may also be called an annual well check.  Regular dental visits and eye exams.  Immunizations.  Screening for certain conditions.  Healthy lifestyle choices, such as eating a healthy diet, getting regular exercise, not using drugs or products that contain nicotine and tobacco, and limiting alcohol use. What can I expect for my preventive care visit? Physical exam Your health care provider will check your:  Height and weight. This may be used to calculate body mass index (BMI), which tells if you are at a healthy weight.  Heart rate and blood pressure.  Skin for abnormal spots. Counseling Your health care provider may ask you questions about your:  Alcohol, tobacco, and drug use.  Emotional well-being.  Home and relationship well-being.  Sexual activity.  Eating habits.  Work and work Statistician.  Method of birth control.  Menstrual cycle.  Pregnancy history. What immunizations do I need?  Influenza (flu) vaccine  This is recommended every year. Tetanus, diphtheria, and pertussis (Tdap) vaccine  You may need a Td booster every 10 years. Varicella (chickenpox) vaccine  You may need this if you have not been vaccinated. Zoster (shingles) vaccine  You may need this after age 62. Measles, mumps, and rubella (MMR) vaccine  You may need at least one dose of MMR if you were born in 1957 or later. You may also need a second dose. Pneumococcal conjugate (PCV13) vaccine  You may need this if you have certain conditions and  were not previously vaccinated. Pneumococcal polysaccharide (PPSV23) vaccine  You may need one or two doses if you smoke cigarettes or if you have certain conditions. Meningococcal conjugate (MenACWY) vaccine  You may need this if you have certain conditions. Hepatitis A vaccine  You may need this if you have certain conditions or if you travel or work in places where you may be exposed to hepatitis A. Hepatitis B vaccine  You may need this if you have certain conditions or if you travel or work in places where you may be exposed to hepatitis B. Haemophilus influenzae type b (Hib) vaccine  You may need this if you have certain conditions. Human papillomavirus (HPV) vaccine  If recommended by your health care provider, you may need three doses over 6 months. You may receive vaccines as individual doses or as more than one vaccine together in one shot (combination vaccines). Talk with your health care provider about the risks and benefits of combination vaccines. What tests do I need? Blood tests  Lipid and cholesterol levels. These may be checked every 5 years, or more frequently if you are over 64 years old.  Hepatitis C test.  Hepatitis B test. Screening  Lung cancer screening. You may have this screening every year starting at age 22 if you have a 30-pack-year history of smoking and currently smoke or have quit within the past 15 years.  Colorectal cancer screening. All adults should have this screening starting at age 62 and continuing until age 73. Your health care provider may recommend screening at age 66 if you are at increased risk.  You will have tests every 1-10 years, depending on your results and the type of screening test.  Diabetes screening. This is done by checking your blood sugar (glucose) after you have not eaten for a while (fasting). You may have this done every 1-3 years.  Mammogram. This may be done every 1-2 years. Talk with your health care provider about  when you should start having regular mammograms. This may depend on whether you have a family history of breast cancer.  BRCA-related cancer screening. This may be done if you have a family history of breast, ovarian, tubal, or peritoneal cancers.  Pelvic exam and Pap test. This may be done every 3 years starting at age 18. Starting at age 103, this may be done every 5 years if you have a Pap test in combination with an HPV test. Other tests  Sexually transmitted disease (STD) testing.  Bone density scan. This is done to screen for osteoporosis. You may have this scan if you are at high risk for osteoporosis. Follow these instructions at home: Eating and drinking  Eat a diet that includes fresh fruits and vegetables, whole grains, lean protein, and low-fat dairy.  Take vitamin and mineral supplements as recommended by your health care provider.  Do not drink alcohol if: ? Your health care provider tells you not to drink. ? You are pregnant, may be pregnant, or are planning to become pregnant.  If you drink alcohol: ? Limit how much you have to 0-1 drink a day. ? Be aware of how much alcohol is in your drink. In the [place] one drink equals one 12 oz bottle of beer (355 mL), one 5 oz glass of wine (148 mL), or one 1 oz glass of hard liquor (44 mL). Lifestyle  Take daily care of your teeth and gums.  Stay active. Exercise for at least 30 minutes on 5 or more days each week.  Do not use any products that contain nicotine or tobacco, such as cigarettes, e-cigarettes, and chewing tobacco. If you need help quitting, ask your health care provider.  If you are sexually active, practice safe sex. Use a condom or other form of birth control (contraception) in order to prevent pregnancy and STIs (sexually transmitted infections).  If told by your health care provider, take low-dose aspirin daily starting at age 65. What's next?  Visit your health care provider once a year for a well check  visit.  Ask your health care provider how often you should have your eyes and teeth checked.  Stay up to date on all vaccines. This information is not intended to replace advice given to you by your health care provider. Make sure you discuss any questions you have with your health care provider. Document Revised: 08/11/2018 Document Reviewed: 08/11/2018 Elsevier Patient Education  2020 [employer].

## 2020-10-29 NOTE — Progress Notes (Signed)
Subjective:    Patient ID: Valerie Jones, female    DOB: 05-06-1971, 49 y.o.   MRN: [id number]  HPI  Patient presents today for complete physical.  Immunizations: Pfizer x 2, flu shot up to date, tetanus is due.  Tdap today Diet:  Healthy.  Exercise: enjoys running, lifting weights Colonoscopy: 03/06/20 Pap Smear:03/22/17- scheduled next week with GYN Mammogram: 02/20/2020 Vision: up to date Dental:  Up to date   Review of Systems  Constitutional: Negative for unexpected weight change.  HENT: Negative for hearing loss and rhinorrhea.   Eyes: Negative for visual disturbance.  Respiratory: Negative for cough and shortness of breath.   Cardiovascular: Negative for chest pain.  Gastrointestinal: Negative for constipation and diarrhea.  Genitourinary: Negative for dysuria and frequency.  Musculoskeletal: Negative for arthralgias and myalgias.       Reports occasional epigastric pain- intermittent  Skin: Negative for rash.  Neurological: Negative for headaches.  Hematological: Negative for adenopathy.  Psychiatric/Behavioral:       Working with a Transport planner.        Past Medical History:  Diagnosis Date  . Abdominal pain 12/21/2016  . Acid reflux 08/16/2017  . Anxiety 03/24/2018  . ASCUS of cervix with negative high risk HPV 03/2017  . GERD (gastroesophageal reflux disease)   . H/O atopic dermatitis 03/17/2015  . Hemorrhoids   . History of chicken pox   . Post-operative nausea and vomiting   . Preventative health care 12/21/2016  . Urinary incontinence 12/21/2016  . Vitamin D deficiency 12/21/2016     Social History   Socioeconomic History  . Marital status: Married    Spouse name: Not on file  . Number of children: Not on file  . Years of education: Not on file  . Highest education level: Not on file  Occupational History  . Not on file  Tobacco Use  . Smoking status: Never Smoker  . Smokeless tobacco: Never Used  Vaping Use  . Vaping Use: Never used  Substance and  Sexual Activity  . Alcohol use: Yes    Alcohol/week: 6.0 standard drinks    Types: 6 Glasses of wine per week  . Drug use: No  . Sexual activity: Yes    Birth control/protection: I.U.D.    Comment: Mirena inserted 11/13/2016,,Pt declined sexual Hx   Other Topics Concern  . Not on file  Social History Narrative  . Not on file   Social Determinants of Health   Financial Resource Strain:   . Difficulty of Paying Living Expenses: Not on file  Food Insecurity:   . Worried About Charity fundraiser in the Last Year: Not on file  . Ran Out of Food in the Last Year: Not on file  Transportation Needs:   . Lack of Transportation (Medical): Not on file  . Lack of Transportation (Non-Medical): Not on file  Physical Activity:   . Days of Exercise per Week: Not on file  . Minutes of Exercise per Session: Not on file  Stress:   . Feeling of Stress : Not on file  Social Connections:   . Frequency of Communication with Friends and Family: Not on file  . Frequency of Social Gatherings with Friends and Family: Not on file  . Attends Religious Services: Not on file  . Active Member of Clubs or Organizations: Not on file  . Attends Archivist Meetings: Not on file  . Marital Status: Not on file  Intimate Partner Violence:   .  Fear of Current or Ex-Partner: Not on file  . Emotionally Abused: Not on file  . Physically Abused: Not on file  . Sexually Abused: Not on file    Past Surgical History:  Procedure Laterality Date  . APPENDECTOMY  1988  . COLONOSCOPY  03/06/2020  . INTRAUTERINE DEVICE INSERTION  11/13/2016   MIRENA    Family History  Problem Relation Age of Onset  . Hypertension Father   . Stroke Father   . Atrial fibrillation Father   . Dementia Father   . Cancer Maternal Grandfather        Non Hodgkins Lymphoma  . Hypertension Sister   . Other Sister        prediabetes  . Breast cancer Sister 6  . Cancer Sister        breast  . Mental illness Mother         anxiety  . Irritable bowel syndrome Mother   . Dementia Mother   . Colon polyps Mother   . COPD Maternal Grandmother        smoker  . Colon cancer Neg Hx   . Esophageal cancer Neg Hx   . Stomach cancer Neg Hx   . Rectal cancer Neg Hx     Allergies  Allergen Reactions  . Clindamycin/Lincomycin Hives  . Sulfa Antibiotics Hives    Current Outpatient Medications on File Prior to Visit  Medication Sig Dispense Refill  . levonorgestrel (MIRENA) 20 MCG/24HR IUD 1 each by Intrauterine route once.    Marland Kitchen omeprazole (PRILOSEC) 40 MG capsule Take 1 capsule (40 mg total) by mouth daily. 90 capsule 1  . VITAMIN D PO Take 1,000 units of lipase by mouth.     No current facility-administered medications on file prior to visit.    BP (!) 97/54 (BP Location: Right Arm, Patient Position: Sitting, Cuff Size: Small)   Pulse (!) 51   Temp 98.6 F (37 C) (Oral)   Resp 16   Ht 5\' 4"  (1.626 m)   Wt 131 lb (59.4 kg)   SpO2 98%   BMI 22.49 kg/m    Objective:   Physical Exam  Physical Exam  Constitutional: She is oriented to person, place, and time. She appears well-developed and well-nourished. No distress.  HENT:  Head: Normocephalic and atraumatic.  Right Ear: Tympanic membrane and ear canal normal.  Left Ear: Tympanic membrane and ear canal normal.  Mouth/Throat: not examined- pt wearing mask Eyes: Pupils are equal, round, and reactive to light. No scleral icterus.  Neck: Normal range of motion. No thyromegaly present.  Cardiovascular: Normal rate and regular rhythm.   No murmur heard. Pulmonary/Chest: Effort normal and breath sounds normal. No respiratory distress. He has no wheezes. She has no rales. She exhibits no tenderness.  Abdominal: Soft. Bowel sounds are normal. She exhibits no distension and no mass. There is no tenderness. There is no rebound and no guarding.  Musculoskeletal: She exhibits no edema.  Lymphadenopathy:    She has no cervical adenopathy.  Neurological: She is  alert and oriented to person, place, and time. She has normal patellar reflexes. She exhibits normal muscle tone. Coordination normal.  Skin: Skin is warm and dry.  Psychiatric: She has a normal mood and affect. Her behavior is normal. Judgment and thought content normal.  Breast/pelvic: deferred to [place]:    Preventative care- encouraged pt to continue healthy diet, regular exercise. Tdap today.  She will have pap with GYN. She is considering covid booster.  Wants to double check insurance coverage on the shingrix vaccine.   Epigastric pain- will check LFT's. She is on PPI.  Had a remote abdominal US for the same reason which showed a normal gallbladder. We discussed possibility of GI referral. She will consider and reach out to [place] if she decides she wants to proceed.  This visit occurred during the SARS-CoV-2 public health emergency.  Safety protocols were in place, including screening questions prior to the visit, additional usage of staff PPE, and extensive cleaning of exam room while observing appropriate contact time as indicated for disinfecting solutions.        Assessment & Plan:

## 2020-10-30 LAB — COMPREHENSIVE METABOLIC PANEL
ALT: 9 U/L (ref 0–35)
AST: 13 U/L (ref 0–37)
Albumin: 4.2 g/dL (ref 3.5–5.2)
Alkaline Phosphatase: 33 U/L — ABNORMAL LOW (ref 39–117)
BUN: 16 mg/dL (ref 6–23)
CO2: 31 mEq/L (ref 19–32)
Calcium: 8.9 mg/dL (ref 8.4–10.5)
Chloride: 100 mEq/L (ref 96–112)
Creatinine, Ser: 0.8 mg/dL (ref 0.40–1.20)
GFR: 85.19 mL/min (ref >60.00)
Glucose, Bld: 84 mg/dL (ref 70–99)
Potassium: 3.7 mEq/L (ref 3.5–5.1)
Sodium: 138 mEq/L (ref 135–145)
Total Bilirubin: 0.5 mg/dL (ref 0.2–1.2)
Total Protein: 6.3 g/dL (ref 6.0–8.3)

## 2020-10-30 LAB — LIPASE: Lipase: 56 U/L (ref 11.0–59.0)

## 2020-10-31 DIAGNOSIS — F3289 Other specified depressive episodes: Secondary | ICD-10-CM | POA: Diagnosis not present

## 2020-11-11 ENCOUNTER — Ambulatory Visit (INDEPENDENT_AMBULATORY_CARE_PROVIDER_SITE_OTHER): Payer: 59 | Admitting: Nurse Practitioner

## 2020-11-11 ENCOUNTER — Other Ambulatory Visit: Payer: Self-pay

## 2020-11-11 ENCOUNTER — Encounter: Payer: Self-pay | Admitting: Nurse Practitioner

## 2020-11-11 VITALS — BP 122/80 | Ht 64.0 in | Wt 130.0 lb

## 2020-11-11 DIAGNOSIS — Z30431 Encounter for routine checking of intrauterine contraceptive device: Secondary | ICD-10-CM

## 2020-11-11 DIAGNOSIS — Z01419 Encounter for gynecological examination (general) (routine) without abnormal findings: Secondary | ICD-10-CM | POA: Diagnosis not present

## 2020-11-11 DIAGNOSIS — R87610 Atypical squamous cells of undetermined significance on cytologic smear of cervix (ASC-US): Secondary | ICD-10-CM | POA: Diagnosis not present

## 2020-11-11 NOTE — Progress Notes (Signed)
   Valerie Jones Jul 29, 1971 [id number]   History:  49 y.o. G2P2002 presents for annual exam without GYN complaints.  Normal pap and mammogram history.  Mirena IUD inserted 11/2016.  Gynecologic History No LMP recorded (lmp unknown). (Menstrual status: IUD).   Contraception: IUD Last Pap: 03/2017. Results were: ASCUS negative HPV Last mammogram: 02/2020. Results were: normal Last colonoscopy: 02/2020. Results were: polyps, 3-5 year recall  Past medical history, past surgical history, family history and social history were all reviewed and documented in the EPIC chart.  ROS:  A ROS was performed and pertinent positives and negatives are included.  Exam:  Vitals:   11/11/20 1618  BP: 122/80  Weight: 130 lb (59 kg)  Height: 5\' 4"  (1.626 m)   Body mass index is 22.31 kg/m.  General appearance:  Normal Thyroid:  Symmetrical, normal in size, without palpable masses or nodularity. Respiratory  Auscultation:  Clear without wheezing or rhonchi Cardiovascular  Auscultation:  Regular rate, without rubs, murmurs or gallops  Edema/varicosities:  Not grossly evident Abdominal  Soft,nontender, without masses, guarding or rebound.  Liver/spleen:  No organomegaly noted  Hernia:  None appreciated  Skin  Inspection:  Grossly normal   Breasts: Examined lying and sitting.   Right: Without masses, retractions, discharge or axillary adenopathy.   Left: Without masses, retractions, discharge or axillary adenopathy. Gentitourinary   Inguinal/mons:  Normal without inguinal adenopathy  External genitalia:  Normal  BUS/Urethra/Skene's glands:  Normal  Vagina:  Atrophic changes  Cervix:  Normal. IUD string visible  Uterus:  Nnormal in size, shape and contour.  Midline and mobile  Adnexa/parametria:     Rt: Without masses or tenderness.   Lt: Without masses or tenderness.  Anus and perineum: Normal  Digital rectal exam: Normal sphincter tone without palpated masses or  tenderness  Assessment/Plan:  49 y.o. [id number] for annual exam.   Well female exam with routine gynecological exam - Education provided on SBEs, importance of preventative screenings, current guidelines, high calcium diet, regular exercise, and multivitamin daily. Labs with PCP.   Encounter for routine checking of intrauterine contraceptive device (IUD) -amenorrheic.  Mirena IUD inserted 11/2016.  She is aware this is a 5-year method.  ASCUS of cervix with negative high risk HPV - 2018, otherwise normal Pap history.  Pap with reflex today.  Screening for breast cancer -normal mammogram history.  Continue annual screenings.  Normal breast exam today.  Screening for colon cancer -we will repeat screening colonoscopy at GI's recommended interval.  Follow-up in 1 year for annual.      Tamela Gammon [facility], 4:30 PM 11/11/2020

## 2020-11-11 NOTE — Patient Instructions (Signed)
Health Maintenance, Female Adopting a healthy lifestyle and getting preventive care are important in promoting health and wellness. Ask your health care provider about:  The right schedule for you to have regular tests and exams.  Things you can do on your own to prevent diseases and keep yourself healthy. What should I know about diet, weight, and exercise? Eat a healthy diet   Eat a diet that includes plenty of vegetables, fruits, low-fat dairy products, and lean protein.  Do not eat a lot of foods that are high in solid fats, added sugars, or sodium. Maintain a healthy weight Body mass index (BMI) is used to identify weight problems. It estimates body fat based on height and weight. Your health care provider can help determine your BMI and help you achieve or maintain a healthy weight. Get regular exercise Get regular exercise. This is one of the most important things you can do for your health. Most adults should:  Exercise for at least 150 minutes each week. The exercise should increase your heart rate and make you sweat (moderate-intensity exercise).  Do strengthening exercises at least twice a week. This is in addition to the moderate-intensity exercise.  Spend less time sitting. Even light physical activity can be beneficial. Watch cholesterol and blood lipids Have your blood tested for lipids and cholesterol at 51 years of age, then have this test every 5 years. Have your cholesterol levels checked more often if:  Your lipid or cholesterol levels are high.  You are older than 51 years of age.  You are at high risk for heart disease. What should I know about cancer screening? Depending on your health history and family history, you may need to have cancer screening at various ages. This may include screening for:  Breast cancer.  Cervical cancer.  Colorectal cancer.  Skin cancer.  Lung cancer. What should I know about heart disease, diabetes, and high blood  pressure? Blood pressure and heart disease  High blood pressure causes heart disease and increases the risk of stroke. This is more likely to develop in people who have high blood pressure readings, are of African descent, or are overweight.  Have your blood pressure checked: ? Every 3-5 years if you are 18-39 years of age. ? Every year if you are 40 years old or older. Diabetes Have regular diabetes screenings. This checks your fasting blood sugar level. Have the screening done:  Once every three years after age 40 if you are at a normal weight and have a low risk for diabetes.  More often and at a younger age if you are overweight or have a high risk for diabetes. What should I know about preventing infection? Hepatitis B If you have a higher risk for hepatitis B, you should be screened for this virus. Talk with your health care provider to find out if you are at risk for hepatitis B infection. Hepatitis C Testing is recommended for:  Everyone born from 1945 through 1965.  Anyone with known risk factors for hepatitis C. Sexually transmitted infections (STIs)  Get screened for STIs, including gonorrhea and chlamydia, if: ? You are sexually active and are younger than 51 years of age. ? You are older than 51 years of age and your health care provider tells you that you are at risk for this type of infection. ? Your sexual activity has changed since you were last screened, and you are at increased risk for chlamydia or gonorrhea. Ask your health care provider if   you are at risk.  Ask your health care provider about whether you are at high risk for HIV. Your health care provider may recommend a prescription medicine to help prevent HIV infection. If you choose to take medicine to prevent HIV, you should first get tested for HIV. You should then be tested every 3 months for as long as you are taking the medicine. Pregnancy  If you are about to stop having your period (premenopausal) and  you may become pregnant, seek counseling before you get pregnant.  Take 400 to 800 micrograms (mcg) of folic acid every day if you become pregnant.  Ask for birth control (contraception) if you want to prevent pregnancy. Osteoporosis and menopause Osteoporosis is a disease in which the bones lose minerals and strength with aging. This can result in bone fractures. If you are 65 years old or older, or if you are at risk for osteoporosis and fractures, ask your health care provider if you should:  Be screened for bone loss.  Take a calcium or vitamin D supplement to lower your risk of fractures.  Be given hormone replacement therapy (HRT) to treat symptoms of menopause. Follow these instructions at home: Lifestyle  Do not use any products that contain nicotine or tobacco, such as cigarettes, e-cigarettes, and chewing tobacco. If you need help quitting, ask your health care provider.  Do not use street drugs.  Do not share needles.  Ask your health care provider for help if you need support or information about quitting drugs. Alcohol use  Do not drink alcohol if: ? Your health care provider tells you not to drink. ? You are pregnant, may be pregnant, or are planning to become pregnant.  If you drink alcohol: ? Limit how much you use to 0-1 drink a day. ? Limit intake if you are breastfeeding.  Be aware of how much alcohol is in your drink. In the [place] one drink equals one 12 oz bottle of beer (355 mL), one 5 oz glass of wine (148 mL), or one 1 oz glass of hard liquor (44 mL). General instructions  Schedule regular health, dental, and eye exams.  Stay current with your vaccines.  Tell your health care provider if: ? You often feel depressed. ? You have ever been abused or do not feel safe at home. Summary  Adopting a healthy lifestyle and getting preventive care are important in promoting health and wellness.  Follow your health care provider's instructions about healthy  diet, exercising, and getting tested or screened for diseases.  Follow your health care provider's instructions on monitoring your cholesterol and blood pressure. This information is not intended to replace advice given to you by your health care provider. Make sure you discuss any questions you have with your health care provider. Document Revised: 11/23/2018 Document Reviewed: 11/23/2018 Elsevier Patient Education  2020 Elsevier Inc.  

## 2020-11-12 LAB — PAP IG W/ RFLX HPV ASCU

## 2020-11-29 ENCOUNTER — Encounter: Payer: Self-pay | Admitting: Family Medicine

## 2020-11-29 ENCOUNTER — Other Ambulatory Visit: Payer: Self-pay | Admitting: Family Medicine

## 2020-11-29 MED ORDER — BUSPIRONE HCL 5 MG PO TABS: 5.0000 mg | ORAL_TABLET | Freq: Two times a day (BID) | ORAL | 1 refills | Status: DC | PRN

## 2020-12-11 DIAGNOSIS — F3289 Other specified depressive episodes: Secondary | ICD-10-CM | POA: Diagnosis not present

## 2020-12-25 MED FILL — OMEPRAZOLE 40 MG CPDR: 40 | 90 days supply | Qty: 90 | Fill #1

## 2020-12-30 ENCOUNTER — Encounter: Payer: Self-pay | Admitting: Family Medicine

## 2020-12-31 ENCOUNTER — Other Ambulatory Visit: Payer: Self-pay | Admitting: Family Medicine

## 2020-12-31 MED ORDER — BUSPIRONE HCL 5 MG PO TABS: 5.0000 mg | ORAL_TABLET | Freq: Two times a day (BID) | ORAL | 3 refills | Status: DC | PRN

## 2020-12-31 MED FILL — busPIRone HCL 5 MG TABS: 5 | 30 days supply | Qty: 60 | Fill #0

## 2021-02-14 ENCOUNTER — Encounter: Payer: Self-pay | Admitting: Family Medicine

## 2021-02-16 ENCOUNTER — Other Ambulatory Visit: Payer: Self-pay | Admitting: Family Medicine

## 2021-02-16 MED ORDER — CEPHALEXIN 500 MG PO CAPS: 500.0000 mg | ORAL_CAPSULE | Freq: Three times a day (TID) | ORAL | 0 refills | Status: DC

## 2021-03-06 DIAGNOSIS — H43813 Vitreous degeneration, bilateral: Secondary | ICD-10-CM | POA: Diagnosis not present

## 2021-03-31 ENCOUNTER — Other Ambulatory Visit: Payer: Self-pay | Admitting: Family Medicine

## 2021-03-31 ENCOUNTER — Encounter: Payer: Self-pay | Admitting: Family Medicine

## 2021-03-31 MED ORDER — BACITRACIN-POLYMYXIN B 500-10000 UNIT/GM OP OINT: 1.0000 "application " | TOPICAL_OINTMENT | Freq: Two times a day (BID) | OPHTHALMIC | 0 refills | Status: DC

## 2021-04-24 ENCOUNTER — Encounter: Payer: Self-pay | Admitting: Family Medicine

## 2021-04-25 ENCOUNTER — Other Ambulatory Visit (HOSPITAL_BASED_OUTPATIENT_CLINIC_OR_DEPARTMENT_OTHER): Payer: Self-pay

## 2021-04-25 MED ORDER — OMEPRAZOLE 40 MG PO CPDR
DELAYED_RELEASE_CAPSULE | Freq: Every day | ORAL | 1 refills | Status: DC
  Filled 2021-04-25: qty 90, 90d supply, fill #0

## 2021-04-28 DIAGNOSIS — H00015 Hordeolum externum left lower eyelid: Secondary | ICD-10-CM | POA: Diagnosis not present

## 2021-05-08 ENCOUNTER — Other Ambulatory Visit: Payer: Self-pay

## 2021-05-08 ENCOUNTER — Ambulatory Visit (INDEPENDENT_AMBULATORY_CARE_PROVIDER_SITE_OTHER): Payer: 59 | Admitting: Sports Medicine

## 2021-05-08 VITALS — BP 104/62 | Ht 64.0 in | Wt 127.0 lb

## 2021-05-08 DIAGNOSIS — M7632 Iliotibial band syndrome, left leg: Secondary | ICD-10-CM

## 2021-05-08 NOTE — Progress Notes (Signed)
  Valerie Jones - 50 y.o. female MRN [id number]  Date of birth: September 07, 1971  SUBJECTIVE:    CC: L Knee pain  Patient presents with complaints of pain in the lateral portion of her left knee during a 50 mile hike recently. The pain was only present on the downhill portions of the hike. She rates it as a 7/10 on the pain scale. The pain would resolve on uphill and flat portions of the hike. She did take some Tylenol for it. She denies any popping or cracking of the knee. She does not have any lingering pain at the moment. She does have a history of IT band issues in her L leg in 2017.    Objective:  VS: BP:104/62  HR: bpm  TEMP: ( )  RESP:   HT:5\' 4"  (162.6 cm)   WT:127 lb (57.6 kg)  BMI:21.79 PHYSICAL EXAM:  Well appearing female in no apparent distress. A&O x3.  Left hip: Hip weakness noted with resisted abduction.  L Knee: No erythema or swelling of the joint. FROM without pain. TTP at the distal aspect of the IT band. No joint line tenderness. Ligaments intact. Negative Mcmurrays and lachmans. Obers test negative. Nobles test with pain elicited at the distal aspect of the IT band.   ASSESSMENT & PLAN:    1. Left IT Band syndrome: Her pain associated with downhill activity and pain at the distal IT band with a positive nobles test demonstrates that this is most likely IT band syndrome. We will give the patient abductor strengthening exercises to be done daily. She can follow up in 6 weeks for reassessment and further evaluation.  Marcelino Duster, MS4  Patient seen and evaluated with the medical student.  I agree with the above plan of care.  Treatment as above for distal IT band syndrome and follow-up in 6 weeks.  We will retest her hip abductor strength at that time.  If symptoms are not improving, consider imaging.  She will call with questions or concerns in the interim.

## 2021-05-13 ENCOUNTER — Other Ambulatory Visit: Payer: Self-pay | Admitting: Family Medicine

## 2021-05-13 DIAGNOSIS — Z1231 Encounter for screening mammogram for malignant neoplasm of breast: Secondary | ICD-10-CM

## 2021-05-16 ENCOUNTER — Ambulatory Visit: Payer: 59

## 2021-06-12 ENCOUNTER — Other Ambulatory Visit (HOSPITAL_COMMUNITY): Payer: Self-pay

## 2021-06-12 ENCOUNTER — Encounter: Payer: Self-pay | Admitting: Family Medicine

## 2021-06-12 MED ORDER — CEPHALEXIN 500 MG PO CAPS
500.0000 mg | ORAL_CAPSULE | Freq: Two times a day (BID) | ORAL | 0 refills | Status: AC
  Filled 2021-06-12: qty 14, 7d supply, fill #0

## 2021-06-19 ENCOUNTER — Ambulatory Visit: Payer: 59 | Admitting: Sports Medicine

## 2021-06-22 IMAGING — MG DIGITAL SCREENING BILAT W/ TOMO W/ CAD
8 series · 9 of 24 positions shown · non-contrast
Comparison: Previous exam(s).

CLINICAL DATA: Screening.

EXAM:
DIGITAL SCREENING BILATERAL MAMMOGRAM WITH TOMO AND CAD

[L CC synth-2D]
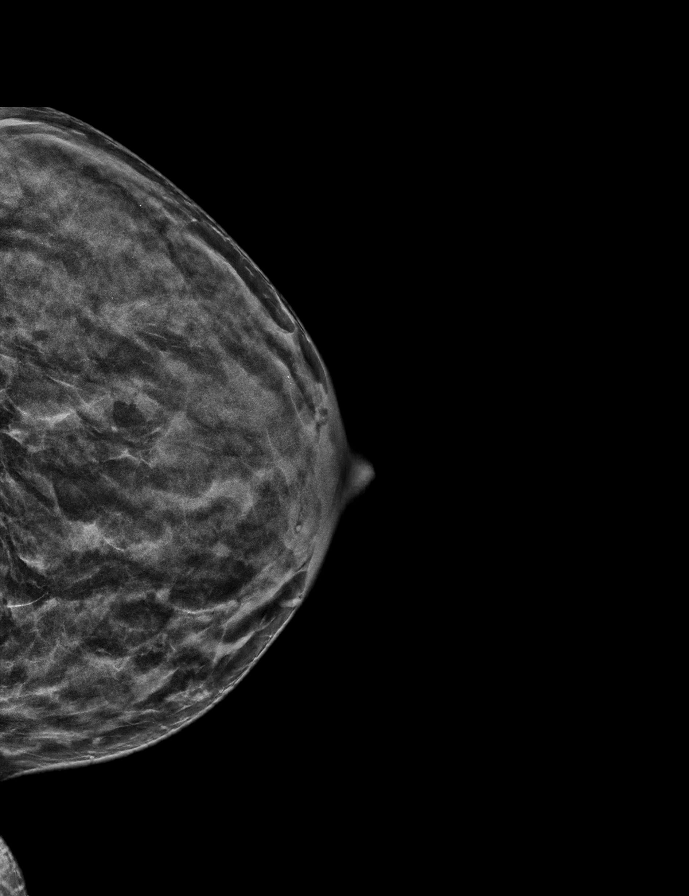

[R CC synth-2D]
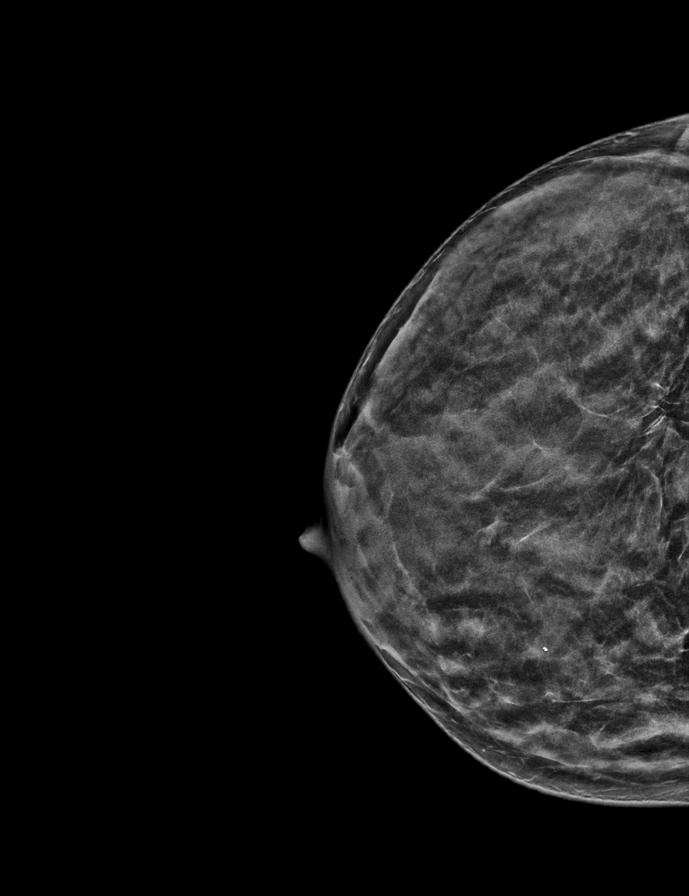

[L MLO synth-2D]
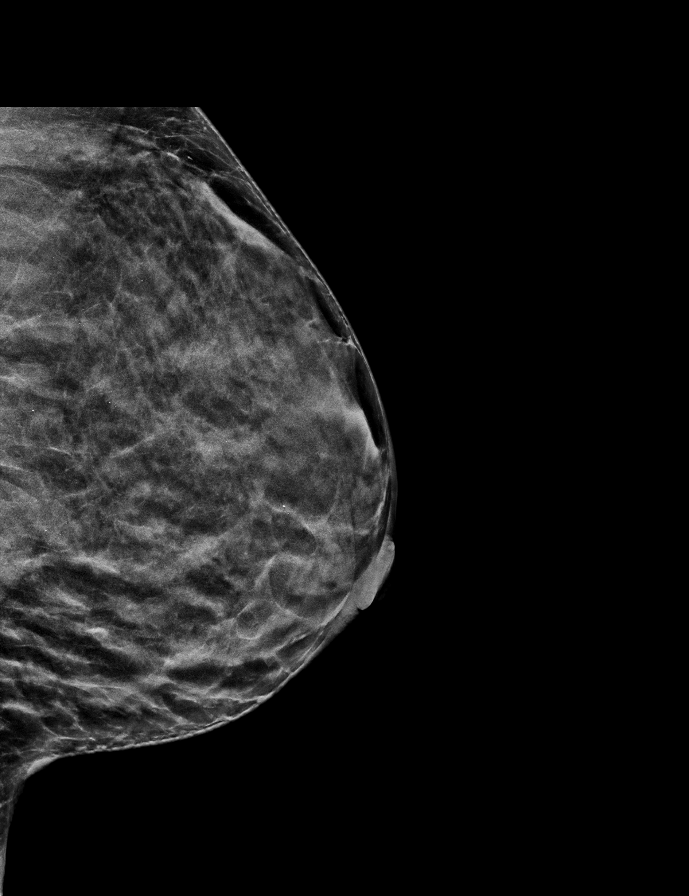

[R MLO synth-2D]
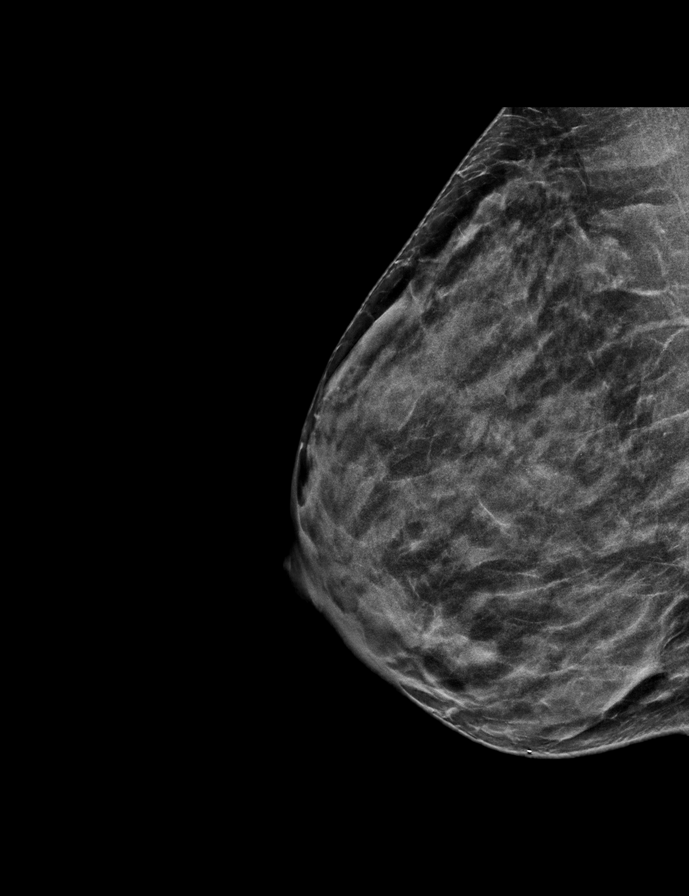

[L MLO tomo · 2 of 50 frames shown]
[frame 17/50]
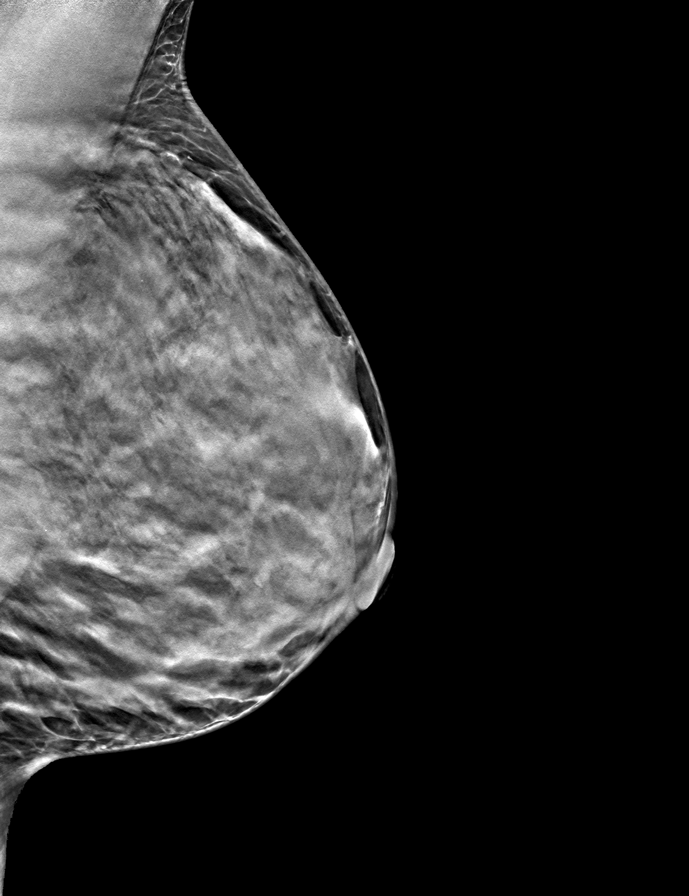
[frame 25/50]
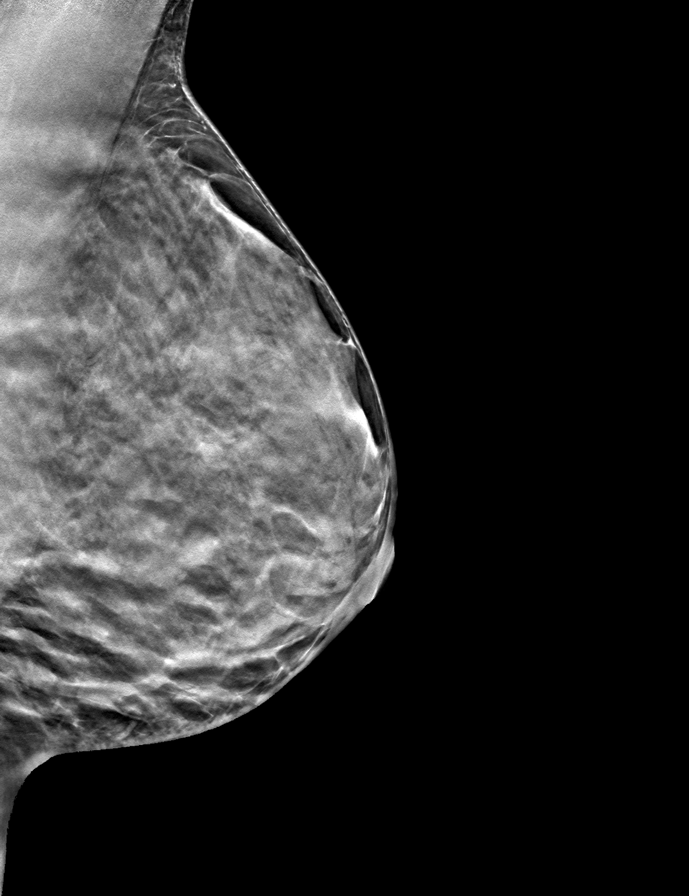

[R CC tomo · tomo slice 27/52.0]
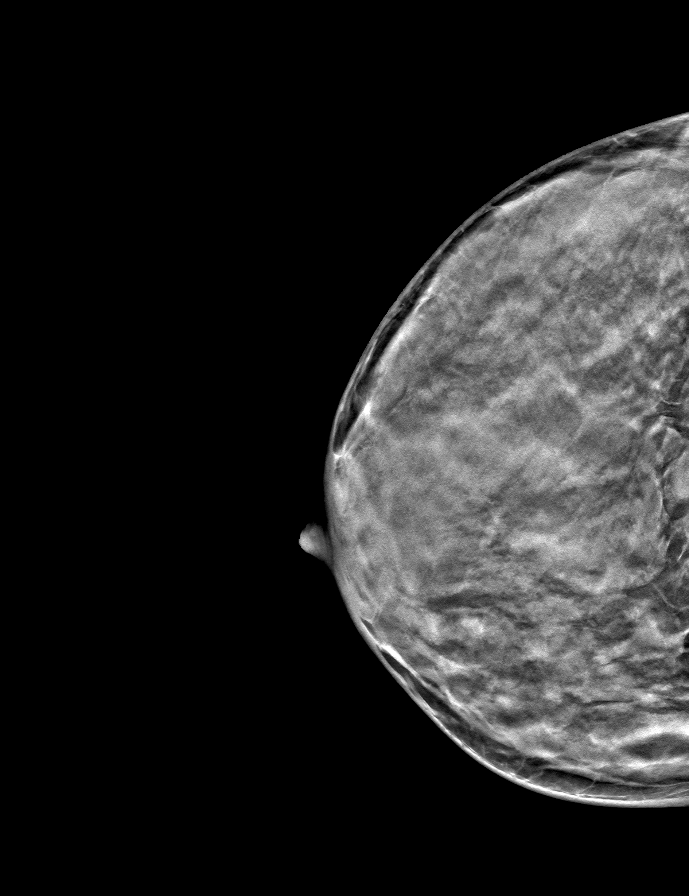

[R MLO tomo · tomo slice 27/54.0]
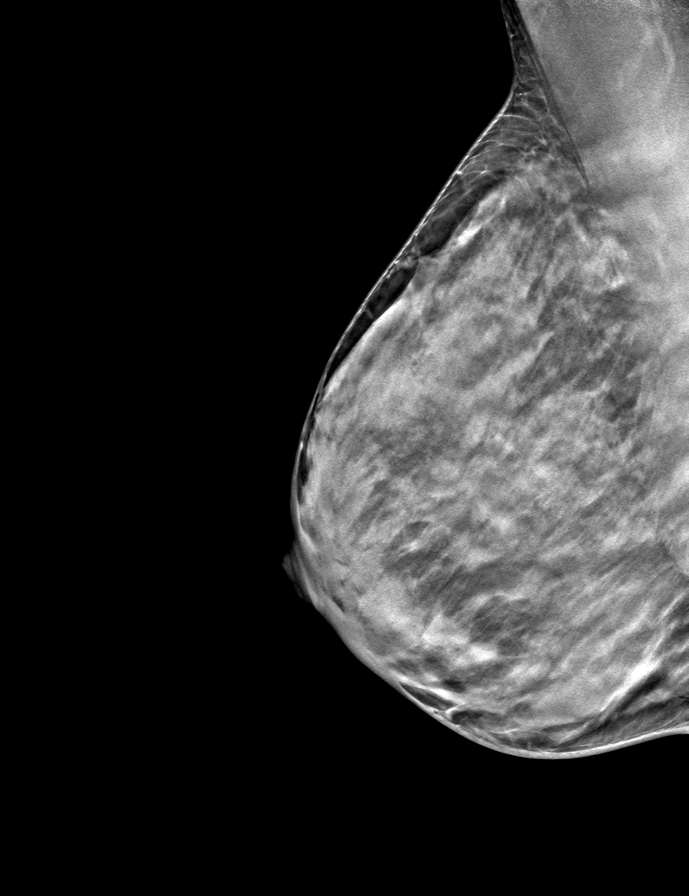

[L CC tomo · tomo slice 25/49.0]
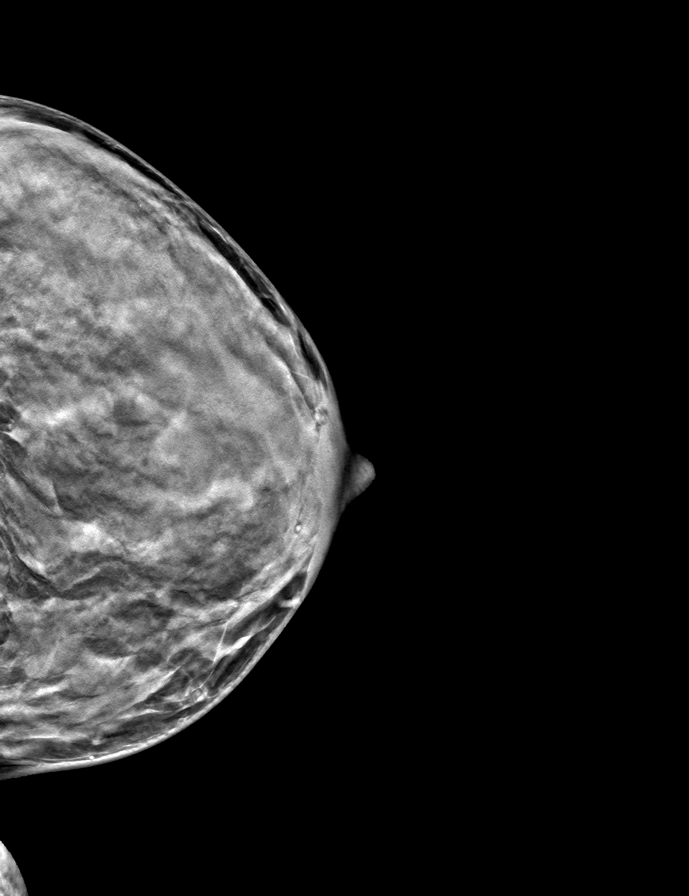

[9 of 24 positions shown; findings below may reference images not displayed]

ACR Breast Density Category d: The breast tissue is extremely dense,
which lowers the sensitivity of mammography
FINDINGS: There are no findings suspicious for malignancy. Images were
processed with CAD.
IMPRESSION: No mammographic evidence of malignancy. A result letter of this
screening mammogram will be mailed directly to the patient.

RECOMMENDATION:
Screening mammogram in one year. (Code:WO-0-ZI0)

BI-RADS CATEGORY  1: Negative.

## 2021-07-10 ENCOUNTER — Other Ambulatory Visit: Payer: Self-pay

## 2021-07-10 ENCOUNTER — Ambulatory Visit
Admission: RE | Admit: 2021-07-10 | Discharge: 2021-07-10 | Disposition: A | Discharge status: Home / Self Care | Payer: 59 | Source: Ambulatory Visit | Attending: Family Medicine | Admitting: Family Medicine

## 2021-07-10 DIAGNOSIS — Z1231 Encounter for screening mammogram for malignant neoplasm of breast: Secondary | ICD-10-CM | POA: Diagnosis not present

## 2021-08-14 DIAGNOSIS — H5213 Myopia, bilateral: Secondary | ICD-10-CM | POA: Diagnosis not present

## 2021-10-28 ENCOUNTER — Ambulatory Visit: Payer: 59 | Admitting: Family

## 2021-11-03 ENCOUNTER — Ambulatory Visit (INDEPENDENT_AMBULATORY_CARE_PROVIDER_SITE_OTHER): Payer: 59 | Admitting: Family

## 2021-11-03 ENCOUNTER — Encounter: Payer: Self-pay | Admitting: Family

## 2021-11-03 ENCOUNTER — Other Ambulatory Visit: Payer: Self-pay

## 2021-11-03 DIAGNOSIS — Z0000 Encounter for general adult medical examination without abnormal findings: Secondary | ICD-10-CM | POA: Diagnosis not present

## 2021-11-03 NOTE — Progress Notes (Signed)
Subjective:   By signing my name below, I, Shehryar Baig, attest that this documentation has been prepared under the direction and in the presence of Debbrah Alar NP. 11/03/2021    Patient ID: Valerie Jones, female    DOB: [birth year], 51 y.o.   MRN: [id number]  Chief Complaint  Patient presents with   Annual Exam         HPI Patient is in today for a comprehensive physical exam.   Heart rate- Her heart rate is low during this visit. She denies having any dizziness at this time. She regularly participates in exercise by running 4x a week. Notes that her typical heart rate is about 48 bpm. She denies any dizziness, weakness.   Pulse Readings from Last 3 Encounters:  11/03/21 (!) 43  10/29/20 (!) 51  03/06/20 (!) 52   Birth control- She has an IUD implant for birth control. She does not typically have periods. She denies having any hot flashes at this time. She has an upcomming appointment with her GYN provider and is planning on running tests to see if she is experiencing mesopause.   CPE She denies having any unexpected weight change, ear pain, hearing loss and rhinorrhea, visual disturbance, cough, chest pain and leg swelling, nausea, vomiting, diarrhea, constipation, blood in stool, or dysuria and frequency, for myalgias and arthralgias, rash, headaches, adenopathy, depression or anxiety at this time. Social history- She has no recent changes to her family medical history. She has no recent surgical procedures in the past year. She drinks around 3-4 glasses of wine every week. She continues working as a Electrical engineer. She is married and has 2 children at this time.  Immunizations- She is UTD on tetanus. She is UTD on flu vaccines. She has 3 pfizer Covid-19 vaccines. She was informed of the bivalent Covid-19 vaccine. She is due for shingles vaccines and is interested in receiving it at a later date. She thinks she contracted shingles earlier this year but has not seen a  provider to evaluate it.  Diet- She is maintaining a healthy diet.  Exercise- She participates in exercise by running 4x weekly.  Pap Smear- Mammogram- Last completed 07/10/2021. Results are normal. Repeat in 1 year.  Colonoscopy- Last completed 03/06/2020. Results showed two 5-10 mm polyps in the sigmoid colon and ascending colon, non-bleeding hemorrhoids, otherwise results are normal. Repeat in 3-5 years.  Vision- She is UTD on vision care.  Dental- She is UTD on dental care.    Health Maintenance Due  Topic Date Due   Zoster Vaccines- Shingrix (1 of 2) Never done   COVID-19 Vaccine (3 - Booster for Pfizer series) 02/21/2020   PAP SMEAR-Modifier  03/22/2020    Past Medical History:  Diagnosis Date   Abdominal pain 12/21/2016   Acid reflux 08/16/2017   Anxiety 03/24/2018   ASCUS of cervix with negative high risk HPV 03/2017   GERD (gastroesophageal reflux disease)    H/O atopic dermatitis 03/17/2015   Hemorrhoids    History of chicken pox    Post-operative nausea and vomiting    Preventative health care 12/21/2016   Urinary incontinence 12/21/2016   Vitamin D deficiency 12/21/2016    Past Surgical History:  Procedure Laterality Date   APPENDECTOMY  1988   COLONOSCOPY  03/06/2020   INTRAUTERINE DEVICE INSERTION  11/13/2016   MIRENA    Family History  Problem Relation Age of Onset   Hypertension Father    Stroke Father    Atrial  fibrillation Father    Dementia Father    Cancer Maternal Grandfather        Non Hodgkins Lymphoma   Hypertension Sister    Other Sister        prediabetes   Breast cancer Sister 74   Cancer Sister        breast   Mental illness Mother        anxiety   Irritable bowel syndrome Mother    Dementia Mother    Colon polyps Mother    COPD Maternal Grandmother        smoker   Colon cancer Neg Hx    Esophageal cancer Neg Hx    Stomach cancer Neg Hx    Rectal cancer Neg Hx     Social History   Socioeconomic History   Marital status: Married     Spouse name: Not on file   Number of children: Not on file   Years of education: Not on file   Highest education level: Not on file  Occupational History   Not on file  Tobacco Use   Smoking status: Never   Smokeless tobacco: Never  Vaping Use   Vaping Use: Never used  Substance and Sexual Activity   Alcohol use: Yes    Alcohol/week: 6.0 standard drinks    Types: 6 Glasses of wine per week   Drug use: No   Sexual activity: Not Currently    Birth control/protection: I.U.D.    Comment: Mirena inserted 11/13/2016,,Pt declined sexual Hx   Other Topics Concern   Not on file  Social History Narrative   Works as a Electrical engineer at [employer]   Married   2 children- (both sons) one at home and one has moved out   Enjoys reading, gardening, walking, running   2 dogs      Social Determinants of Radio broadcast assistant Strain: Not on file  Food Insecurity: Not on file  Transportation Needs: Not on file  Physical Activity: Not on file  Stress: Not on file  Social Connections: Not on file  Intimate Partner Violence: Not on file    Outpatient Medications Prior to Visit  Medication Sig Dispense Refill   levonorgestrel (MIRENA) 20 MCG/24HR IUD 1 each by Intrauterine route once.     VITAMIN D PO Take 1,000 units of lipase by mouth.     bacitracin-polymyxin b (POLYSPORIN) ophthalmic ointment Place 1 application into the left eye 2 (two) times daily. apply to eye every 12 hours while awake 3.5 g 0   busPIRone (BUSPAR) 5 MG tablet TAKE 1 TABLET (5 MG TOTAL) BY MOUTH 2 (TWO) TIMES DAILY AS NEEDED. 60 tablet 3   cephALEXin (KEFLEX) 500 MG capsule Take 1 capsule (500 mg total) by mouth 3 (three) times daily. 30 capsule 0   omeprazole (PRILOSEC) 40 MG capsule TAKE 1 CAPSULE (40 MG TOTAL) BY MOUTH DAILY. 90 capsule 1   No facility-administered medications prior to visit.    Allergies  Allergen Reactions   Clindamycin/Lincomycin Hives   Sulfa Antibiotics Hives    Review of Systems   Constitutional:        (-)unexpected weight change (-)Adenopathy  HENT:  Negative for hearing loss.        (-)Rhinorrhea   Eyes:        (-)Visual disturbance  Respiratory:  Negative for cough.   Cardiovascular:  Negative for chest pain and leg swelling.  Gastrointestinal:  Negative for blood in stool, constipation, diarrhea, nausea  and vomiting.  Genitourinary:  Negative for dysuria and frequency.  Musculoskeletal:  Negative for joint pain and myalgias.  Skin:  Negative for rash.  Neurological:  Negative for headaches.  Psychiatric/Behavioral:  Negative for depression. The patient is not nervous/anxious.       Objective:    Physical Exam Constitutional:      General: She is not in acute distress.    Appearance: Normal appearance. She is not ill-appearing.  HENT:     Head: Normocephalic and atraumatic.     Right Ear: Tympanic membrane, ear canal and external ear normal.     Left Ear: Tympanic membrane, ear canal and external ear normal.  Eyes:     Extraocular Movements: Extraocular movements intact.     Pupils: Pupils are equal, round, and reactive to light.  Cardiovascular:     Rate and Rhythm: Normal rate and regular rhythm.     Heart sounds: Normal heart sounds. No murmur heard.   No gallop.     Comments: Pulse measured 48 bpm during recheck Pulmonary:     Effort: Pulmonary effort is normal. No respiratory distress.     Breath sounds: Normal breath sounds. No wheezing or rales.  Abdominal:     General: There is no distension.     Palpations: Abdomen is soft.     Tenderness: There is no abdominal tenderness. There is no guarding.  Skin:    General: Skin is warm and dry.  Neurological:     Mental Status: She is alert and oriented to person, place, and time.     Deep Tendon Reflexes:     Reflex Scores:      Patellar reflexes are 2+ on the right side and 2+ on the left side. Psychiatric:        Behavior: Behavior normal.    BP (!) 111/50 (BP Location: Right Arm,  Patient Position: Sitting, Cuff Size: Small)   Pulse (!) 43   Temp 97.8 F (36.6 C) (Oral)   Resp 16   Ht 5\' 4"  (1.626 m)   Wt 135 lb (61.2 kg)   SpO2 99%   BMI 23.17 kg/m  Wt Readings from Last 3 Encounters:  11/03/21 135 lb (61.2 kg)  05/08/21 127 lb (57.6 kg)  11/11/20 130 lb (59 kg)       Assessment & Plan:   Problem List Items Addressed This Visit       Unprioritized   Preventative health care    Encouraged pt to continue healthy diet and regular exercise. I suspect that her chronic asymptomatic bradycardia is due to being an avid runner. Repeat HR was 48 bpm during her visit. Recommended shingrix, she will plan to get another day.  Flu shot up to date. Recommended that she consider getting the Bivalent Covid Booster. Pap and colo up to date.         No orders of the defined types were placed in this encounter.   I, Debbrah Alar NP, personally preformed the services described in this documentation.  All medical record entries made by the scribe were at my direction and in my presence.  I have reviewed the chart and discharge instructions (if applicable) and agree that the record reflects my personal performance and is accurate and complete. 11/03/2021   I,Shehryar Baig,acting as a Education administrator for Nance Pear, NP.,have documented all relevant documentation on the behalf of Nance Pear, NP,as directed by  Nance Pear, NP while in the presence of [place]  Inda Castle, NP.   Nance Pear, NP

## 2021-11-03 NOTE — Assessment & Plan Note (Addendum)
Encouraged pt to continue healthy diet and regular exercise. I suspect that her chronic asymptomatic bradycardia is due to being an avid runner. Repeat HR was 48 bpm during her visit. Recommended shingrix, she will plan to get another day.  Flu shot up to date. Recommended that she consider getting the Bivalent Covid Booster. Pap and colo up to date.

## 2021-11-03 NOTE — Patient Instructions (Signed)
Continue healthy diet, and exercise.

## 2021-11-12 ENCOUNTER — Other Ambulatory Visit: Payer: Self-pay

## 2021-11-12 ENCOUNTER — Encounter: Payer: Self-pay | Admitting: Nurse Practitioner

## 2021-11-12 ENCOUNTER — Ambulatory Visit (INDEPENDENT_AMBULATORY_CARE_PROVIDER_SITE_OTHER): Payer: 59 | Admitting: Nurse Practitioner

## 2021-11-12 VITALS — BP 114/74 | Ht 63.0 in | Wt 133.0 lb

## 2021-11-12 DIAGNOSIS — Z8639 Personal history of other endocrine, nutritional and metabolic disease: Secondary | ICD-10-CM | POA: Diagnosis not present

## 2021-11-12 DIAGNOSIS — Z30431 Encounter for routine checking of intrauterine contraceptive device: Secondary | ICD-10-CM | POA: Diagnosis not present

## 2021-11-12 DIAGNOSIS — Z01419 Encounter for gynecological examination (general) (routine) without abnormal findings: Secondary | ICD-10-CM

## 2021-11-12 NOTE — Progress Notes (Signed)
   Valerie Jones 03/04/1971 [id number]   History:  50 y.o. G2P2002 presents for annual exam without GYN complaints. Mirena IUD inserted 11/2016.  Normal pap and mammogram history.    Gynecologic History No LMP recorded. (Menstrual status: IUD).   Contraception: IUD Sexually active: Yes  Health maintenance Last Pap: 11/11/2020. Results were: Normal, 3-year repeat Last mammogram: 07/10/2021. Results were: Normal Last colonoscopy: 03/06/2020. Results were: Polyps, 3 year recall Last Dexa: Not indicated  Past medical history, past surgical history, family history and social history were all reviewed and documented in the EPIC chart. Married. 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 2 sons ages 76 (APP for business) and 56 (working for family business). Sister diagnosed with breast cancer at age 44.   ROS:  A ROS was performed and pertinent positives and negatives are included.  Exam:  Vitals:   11/12/21 1604  BP: 114/74  Weight: 133 lb (60.3 kg)  Height: 5\' 3"  (1.6 m)    Body mass index is 23.56 kg/m.  General appearance:  Normal Thyroid:  Symmetrical, normal in size, without palpable masses or nodularity. Respiratory  Auscultation:  Clear without wheezing or rhonchi Cardiovascular  Auscultation:  Regular rate, without rubs, murmurs or gallops  Edema/varicosities:  Not grossly evident Abdominal  Soft,nontender, without masses, guarding or rebound.  Liver/spleen:  No organomegaly noted  Hernia:  None appreciated  Skin  Inspection:  Grossly normal   Breasts: Examined lying and sitting.   Right: Without masses, retractions, discharge or axillary adenopathy.   Left: Without masses, retractions, discharge or axillary adenopathy. Genitourinary   Inguinal/mons:  Normal without inguinal adenopathy  External genitalia:  Normal appearing vulva with no masses, tenderness, or lesions  BUS/Urethra/Skene's glands:  Normal  Vagina:  Normal appearing with normal color and discharge, no  lesions  Cervix:  Normal appearing without discharge or lesions. IUD string visible  Uterus:  Normal in size, shape and contour.  Midline and mobile, nontender  Adnexa/parametria:     Rt: Normal in size, without masses or tenderness.   Lt: Normal in size, without masses or tenderness.  Anus and perineum: Normal, non-bleeding hemorrhoids  Digital rectal exam: Normal sphincter tone without palpated masses or tenderness  Patient informed chaperone available to be present for breast and pelvic exam. Patient has requested no chaperone to be present. Patient has been advised what will be completed during breast and pelvic exam.   Assessment/Plan:  50 y.o. [id number] for annual exam.   Well female exam with routine gynecological exam - Plan: CBC with Differential/Platelet, Comprehensive metabolic panel. Education provided on SBEs, importance of preventative screenings, current guidelines, high calcium diet, regular exercise, and multivitamin daily.   Encounter for routine checking of intrauterine contraceptive device (IUD) -amenorrheic.  Mirena IUD inserted 11/2016.  She is aware of 8-year FDA approval for pregnancy prevention. IUD string visible on exam. She has occasional light spotting.   History of vitamin D deficiency - Plan: VITAMIN D 25 Hydroxy (Vit-D Deficiency, Fractures)  Screening for breast cancer - Normal mammogram history.  Continue annual screenings.  Normal breast exam today. Sister diagnosed with breast cancer at 26.  Screening for colon cancer - 2021 colonoscopy. Will repeat at GI's recommended interval.   Follow-up in 1 year for annual.      Tamela Gammon [facility], 4:29 PM 11/12/2021

## 2021-11-13 LAB — COMPREHENSIVE METABOLIC PANEL
AG Ratio: 1.9 (calc) (ref 1.0–2.5)
ALT: 11 U/L (ref 6–29)
AST: 15 U/L (ref 10–35)
Albumin: 4.2 g/dL (ref 3.6–5.1)
Alkaline phosphatase (APISO): 35 U/L — ABNORMAL LOW (ref 37–153)
BUN: 22 mg/dL (ref 7–25)
CO2: 30 mmol/L (ref 20–32)
Calcium: 9.4 mg/dL (ref 8.6–10.4)
Chloride: 103 mmol/L (ref 98–110)
Creat: 0.84 mg/dL (ref 0.50–1.03)
Globulin: 2.2 g/dL (calc) (ref 1.9–3.7)
Glucose, Bld: 73 mg/dL (ref 65–99)
Potassium: 4.2 mmol/L (ref 3.5–5.3)
Sodium: 139 mmol/L (ref 135–146)
Total Bilirubin: 0.6 mg/dL (ref 0.2–1.2)
Total Protein: 6.4 g/dL (ref 6.1–8.1)

## 2021-11-13 LAB — VITAMIN D 25 HYDROXY (VIT D DEFICIENCY, FRACTURES): Vit D, 25-Hydroxy: 43 ng/mL (ref 30–100)

## 2021-11-13 LAB — CBC WITH DIFFERENTIAL/PLATELET
Absolute Monocytes: 623 cells/uL (ref 200–950)
Basophils Absolute: 49 cells/uL (ref 0–200)
Basophils Relative: 0.7 %
Eosinophils Absolute: 63 cells/uL (ref 15–500)
Eosinophils Relative: 0.9 %
HCT: 39.3 % (ref 35.0–45.0)
Hemoglobin: 12.9 g/dL (ref 11.7–15.5)
Lymphs Abs: 1673 cells/uL (ref 850–3900)
MCH: 31.8 pg (ref 27.0–33.0)
MCHC: 32.8 g/dL (ref 32.0–36.0)
MCV: 96.8 fL (ref 80.0–100.0)
MPV: 10.5 fL (ref 7.5–12.5)
Monocytes Relative: 8.9 %
Neutro Abs: 4592 cells/uL (ref 1500–7800)
Neutrophils Relative %: 65.6 %
Platelets: 284 10*3/uL (ref 140–400)
RBC: 4.06 10*6/uL (ref 3.80–5.10)
RDW: 11.9 % (ref 11.0–15.0)
Total Lymphocyte: 23.9 %
WBC: 7 10*3/uL (ref 3.8–10.8)

## 2021-11-18 ENCOUNTER — Encounter: Payer: Self-pay | Admitting: Sports Medicine

## 2021-11-18 ENCOUNTER — Ambulatory Visit (INDEPENDENT_AMBULATORY_CARE_PROVIDER_SITE_OTHER): Payer: 59 | Admitting: Sports Medicine

## 2021-11-18 VITALS — Ht 64.0 in | Wt 130.0 lb

## 2021-11-18 DIAGNOSIS — M25521 Pain in right elbow: Secondary | ICD-10-CM | POA: Diagnosis not present

## 2021-11-18 DIAGNOSIS — M7520 Bicipital tendinitis, unspecified shoulder: Secondary | ICD-10-CM

## 2021-11-18 NOTE — Progress Notes (Addendum)
   SUBJECTIVE:   CHIEF COMPLAINT / HPI:    Valerie Jones is a 50 y.o. female here for right elbow pain and bilateral shoulder pain. Elbow pain has been present for the past 3-4 months. She notes her elbow pain is worse with lifting objects or raising a coffee mug and radiates down her forearm. At the gym pain is worse with external rotation with bicep curls. No previous injury.   Shoulder pain has been present for the past year (right worse than left).  Her pain is worse with upright rows and overhead press.  She performs resistance training at the gym 3-4x a week. She tries to avoid the exercises that cause significant pain. Notes that she has low back pain and has rested for the past 2 weeks which helped her extremity pain. Took 800 mg ibuprofen or 440 mg Aleve a couple times a day. She is right handed.    PERTINENT  PMH / PSH: reviewed and updated as appropriate   OBJECTIVE:   Ht 5\' 4"  (1.626 m)   Wt 130 lb (59 kg)   BMI 22.31 kg/m    GEN: well appearing female in no acute distress  CVS: well perfused  RESP: speaking in full sentences without pause, no respiratory distress  Shoulder, bilateral: No evidence of bony deformity, asymmetry, or muscle atrophy; right sided tenderness over long head of biceps (bicipital groove). No TTP at [facility] joint. Full active and passive range of motion flexion, extension, internal and external rotation,  Strength 5/5 throughout. No abnormal scapular function observed. Sensation intact. Peripheral pulses intact. Hawkins: positive; Speeds: positive; Yergason: negative Elbow, right: Inspection yields no evidence of bony deformity, effusion, erythema, ecchymosis, or rash. Active and passive ROM intact in flexion/extension/supination/pronation. Strength 5/5 throughout.  TTP at the lateral epicondyle, no medial epicondyle tenderness. No pain with finger/wrist flexion or extension against resistance. No evidence of pain or laxity at the UCL.    ASSESSMENT/PLAN:    Bicep Tendonitis  Pt with 1 year of bilateral shoulder pain. History and exam concerning for right biceps tendonitis; likely bilaterally. Avoid overhead shoulder exercises that may aggravate pain. Provided with scapularis and other rotator cuff stabilizing exercises. Advised to let her body be her guide when exercising.    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 Discussed treatment options. Obtain elbow compression sleeve. Provided home (pronation and eccentric) exercises. OTC NSAIDS PRN. Consider counter force elbow strap while exercising.   Valerie Hensen, DO PGY-3, [facility] 11/18/2021    Patient seen and evaluated with the resident.  I agree with the above plan of care.  Home exercises for both her shoulders and her elbow.  Continue with activity as tolerated.  Follow-up for ongoing or recalcitrant issues.

## 2021-12-15 ENCOUNTER — Other Ambulatory Visit (HOSPITAL_COMMUNITY): Payer: Self-pay

## 2021-12-15 MED ORDER — CARESTART COVID-19 HOME TEST VI KIT
PACK | 0 refills | Status: DC
  Filled 2021-12-15: qty 4, 4d supply, fill #0

## 2022-05-21 DIAGNOSIS — W540XXA Bitten by dog, initial encounter: Secondary | ICD-10-CM | POA: Diagnosis not present

## 2022-05-21 DIAGNOSIS — S91011A Laceration without foreign body, right ankle, initial encounter: Secondary | ICD-10-CM | POA: Diagnosis not present

## 2022-05-28 ENCOUNTER — Encounter: Payer: Self-pay | Admitting: Family Medicine

## 2022-06-01 ENCOUNTER — Ambulatory Visit: Payer: 59 | Admitting: Family Medicine

## 2022-06-01 ENCOUNTER — Encounter: Payer: Self-pay | Admitting: Family Medicine

## 2022-06-01 VITALS — BP 98/70 | HR 50 | Temp 98.3°F | Resp 16 | Ht 64.1 in | Wt 133.2 lb

## 2022-06-01 DIAGNOSIS — S91011A Laceration without foreign body, right ankle, initial encounter: Secondary | ICD-10-CM

## 2022-06-01 NOTE — Progress Notes (Addendum)
Subjective:   By signing my name below, I, Carylon Perches, attest that this documentation has been prepared under the direction and in the presence of Ann Held DO 06/01/2022    Patient ID: Valerie Jones, female    DOB: 1971-09-28, 51 y.o.   MRN: [id number]  Chief Complaint  Patient presents with   Suture / Staple Removal    Right ankle, pt states she was bitten by sisters dog about 11 days ago.     HPI Patient is in today for an office visit.  She is requesting to get her sutures removed. She states that her sister's dog bit her in her right ankle. She reports mild swelling and redness of the area. She is interested in knowing preventative care for scaring in the area. She is reports that she has finished her medication of  125 Mg of Amoxicillin.   Past Medical History:  Diagnosis Date   Abdominal pain 12/21/2016   Acid reflux 08/16/2017   Anxiety 03/24/2018   ASCUS of cervix with negative high risk HPV 03/2017   GERD (gastroesophageal reflux disease)    H/O atopic dermatitis 03/17/2015   Hemorrhoids    History of chicken pox    Post-operative nausea and vomiting    Preventative health care 12/21/2016   Urinary incontinence 12/21/2016   Vitamin D deficiency 12/21/2016    Past Surgical History:  Procedure Laterality Date   APPENDECTOMY  1988   COLONOSCOPY  03/06/2020   INTRAUTERINE DEVICE INSERTION  11/13/2016   MIRENA    Family History  Problem Relation Age of Onset   Hypertension Father    Stroke Father    Atrial fibrillation Father    Dementia Father    Cancer Maternal Grandfather        Non Hodgkins Lymphoma   Hypertension Sister    Other Sister        prediabetes   Breast cancer Sister 62   Cancer Sister        breast   Mental illness Mother        anxiety   Irritable bowel syndrome Mother    Dementia Mother    Colon polyps Mother    COPD Maternal Grandmother        smoker   Colon cancer Neg Hx    Esophageal cancer Neg Hx    Stomach cancer Neg Hx     Rectal cancer Neg Hx     Social History   Socioeconomic History   Marital status: Married    Spouse name: Not on file   Number of children: Not on file   Years of education: Not on file   Highest education level: Not on file  Occupational History   Not on file  Tobacco Use   Smoking status: Never   Smokeless tobacco: Never  Vaping Use   Vaping Use: Never used  Substance and Sexual Activity   Alcohol use: Yes    Alcohol/week: 6.0 standard drinks of alcohol    Types: 6 Glasses of wine per week   Drug use: No   Sexual activity: Yes    Birth control/protection: I.U.D.    Comment: Mirena inserted 11/13/2016,,Pt declined sexual Hx   Other Topics Concern   Not on file  Social History Narrative   Works as a Electrical engineer at [employer]   Married   2 children- (both sons) one at home and one has moved out   Enjoys reading, gardening, walking, running   2  dogs      Social Determinants of Radio broadcast assistant Strain: Not on file  Food Insecurity: Not on file  Transportation Needs: Not on file  Physical Activity: Not on file  Stress: Not on file  Social Connections: Not on file  Intimate Partner Violence: Not on file    Outpatient Medications Prior to Visit  Medication Sig Dispense Refill   levonorgestrel (MIRENA) 20 MCG/24HR IUD 1 each by Intrauterine route once.     VITAMIN D PO Take 1,000 units of lipase by mouth.     COVID-19 At Home Antigen Test [facility] COVID-19 HOME TEST) KIT Use as directed (Patient not taking: Reported on 06/01/2022) 4 each 0   No facility-administered medications prior to visit.    Allergies  Allergen Reactions   Clindamycin/Lincomycin Hives   Sulfa Antibiotics Hives    Review of Systems  Musculoskeletal:        (+) Swelling in Right Ankle  Skin:        (+) Redness of Right Ankle       Objective:    Physical Exam Vitals and nursing note reviewed.  Constitutional:      General: She is not in acute distress.     Appearance: Normal appearance. She is not ill-appearing.  HENT:     Head: Normocephalic and atraumatic.     Right Ear: External ear normal.     Left Ear: External ear normal.  Eyes:     Extraocular Movements: Extraocular movements intact.     Pupils: Pupils are equal, round, and reactive to light.  Cardiovascular:     Rate and Rhythm: Normal rate and regular rhythm.     Heart sounds: Normal heart sounds. No murmur heard.    No gallop.  Pulmonary:     Effort: Pulmonary effort is normal. No respiratory distress.     Breath sounds: Normal breath sounds. No wheezing or rales.  Skin:    General: Skin is warm and dry.     Comments: R ankle---  wound healing well  2 sutures removed after cleaning wound with betadine Then abx ointment put on it and bandaid applied   Neurological:     Mental Status: She is alert and oriented to person, place, and time.  Psychiatric:        Judgment: Judgment normal.     BP 98/70 (BP Location: Right Arm, Patient Position: Sitting, Cuff Size: Normal)   Pulse (!) 50   Temp 98.3 F (36.8 C) (Oral)   Resp 16   Ht 5' 4.1" (1.628 m)   Wt 133 lb 3.2 oz (60.4 kg)   SpO2 98%   BMI 22.79 kg/m  Wt Readings from Last 3 Encounters:  06/01/22 133 lb 3.2 oz (60.4 kg)  11/18/21 130 lb (59 kg)  11/12/21 133 lb (60.3 kg)    Diabetic Foot Exam - Simple   No data filed    Lab Results  Component Value Date   WBC 7.0 11/12/2021   HGB 12.9 11/12/2021   HCT 39.3 11/12/2021   PLT 284 11/12/2021   GLUCOSE 73 11/12/2021   CHOL 186 09/12/2019   TRIG 71.0 09/12/2019   HDL 75.00 09/12/2019   LDLCALC 97 09/12/2019   ALT 11 11/12/2021   AST 15 11/12/2021   NA 139 11/12/2021   K 4.2 11/12/2021   CL 103 11/12/2021   CREATININE 0.84 11/12/2021   BUN 22 11/12/2021   CO2 30 11/12/2021   TSH 1.80 09/12/2019  HGBA1C 5.5 09/12/2019    Lab Results  Component Value Date   TSH 1.80 09/12/2019   Lab Results  Component Value Date   WBC 7.0 11/12/2021   HGB  12.9 11/12/2021   HCT 39.3 11/12/2021   MCV 96.8 11/12/2021   PLT 284 11/12/2021   Lab Results  Component Value Date   NA 139 11/12/2021   K 4.2 11/12/2021   CO2 30 11/12/2021   GLUCOSE 73 11/12/2021   BUN 22 11/12/2021   CREATININE 0.84 11/12/2021   BILITOT 0.6 11/12/2021   ALKPHOS 33 (L) 10/29/2020   AST 15 11/12/2021   ALT 11 11/12/2021   PROT 6.4 11/12/2021   ALBUMIN 4.2 10/29/2020   CALCIUM 9.4 11/12/2021   GFR 85.19 10/29/2020   Lab Results  Component Value Date   CHOL 186 09/12/2019   Lab Results  Component Value Date   HDL 75.00 09/12/2019   Lab Results  Component Value Date   LDLCALC 97 09/12/2019   Lab Results  Component Value Date   TRIG 71.0 09/12/2019   Lab Results  Component Value Date   CHOLHDL 2 09/12/2019   Lab Results  Component Value Date   HGBA1C 5.5 09/12/2019       Assessment & Plan:   Problem List Items Addressed This Visit       Unprioritized   Laceration of right ankle - Primary    2 sutures removed with no complications  Area covered with abx oint and bandaid         No orders of the defined types were placed in this encounter.   IAnn Held, DO, personally preformed the services described in this documentation.  All medical record entries made by the scribe were at my direction and in my presence.  I have reviewed the chart and discharge instructions (if applicable) and agree that the record reflects my personal performance and is accurate and complete. 06/01/2022   I,Amber Collins,acting as a scribe for [employer], DO.,have documented all relevant documentation on the behalf of Ann Held, DO,as directed by  Ann Held, DO while in the presence of Ann Held, DO.  Ann Held, DO

## 2022-06-01 NOTE — Patient Instructions (Signed)
Suture Removal, Care After The following information offers guidance on how to care for yourself after your procedure. Your health care provider may also give you more specific instructions. If you have problems or questions, contact your health care provider. What can I expect after the procedure? After your stitches (sutures) are removed, it is common to have: Some discomfort and swelling in the area. Slight redness in the area. Follow these instructions at home: If you have a dressing: Wash your hands with soap and water for at least 20 seconds before and after you change your bandage (dressing). If soap and water are not available, use hand sanitizer. Change your dressing as told by your health care provider. If your dressing becomes wet or dirty, or develops a bad smell, change it as soon as possible. If your dressing sticks to your skin, pour warm, clean water over it until it loosens and can be removed without pulling apart the wound edges. Pat the area dry with a soft, clean towel. Do not rub the wound because that may cause bleeding. Wound care  Check your wound every day for signs of infection. Check for: More redness, swelling, or pain. Fluid or blood. New warmth, a rash, or hardness at the wound site. Pus or a bad smell. Wash your hands with soap and water for at least 20 seconds before and after touching your wound. If soap and water are not available, use hand sanitizer. Keep the wound area dry and clean. Clean and pat the wound dry as told by your health care provider. Apply cream or ointment only as told by your health care provider. If skin glue or adhesive strips were applied after sutures were removed, leave these closures in place. They may need to stay in place for 2 weeks or longer. If adhesive strip edges start to loosen and curl up, you may trim the loose edges. Do not remove adhesive strips completely unless your health care provider tells you to do that. Continue to  protect the wound from injury. Do not pick at your wound. Picking can cause an infection. Bathing Do not take baths, swim, or use a hot tub until your health care provider approves. Ask your health care provider if you may take showers. Follow these steps for showering: If you have a dressing, remove it before getting into the shower. In the shower, allow soapy water to get on the wound. Avoid scrubbing the wound. When you get out of the shower, dry the wound by patting it with a clean towel. Reapply a dressing over the wound, if needed. Scar care When your wound has completely healed, help decrease the size of your scar by: Wearing sunscreen over the scar or covering it with clothing when you are outside. New scars get sunburned easily, which can make scarring worse. Gently massaging the scarred area. This can decrease scar thickness. General instructions Take over-the-counter and prescription medicines only as told by your health care provider. Keep all follow-up visits. This is important. Contact a health care provider if: You have more redness, swelling, or pain around your wound. You have fluid or blood coming from your wound. You have new warmth, a rash, or hardness at the wound site. You have pus or a bad smell coming from your wound. Your wound opens up. Get help right away if: You have a fever or chills. You have red streaks coming from your wound. Summary After your sutures are removed, it is common to have some discomfort   and swelling in the area. Wash your hands with soap and water before you change your bandage (dressing). Keep the wound area dry and clean. Do not take baths, swim, or use a hot tub until your health care provider approves. This information is not intended to replace advice given to you by your health care provider. Make sure you discuss any questions you have with your health care provider. Document Revised: 03/25/2021 Document Reviewed:  03/25/2021 Elsevier Patient Education  2023 Elsevier Inc.  

## 2022-06-04 DIAGNOSIS — S91011A Laceration without foreign body, right ankle, initial encounter: Secondary | ICD-10-CM | POA: Insufficient documentation

## 2022-06-04 NOTE — Assessment & Plan Note (Signed)
2 sutures removed with no complications  Area covered with abx oint and bandaid

## 2022-09-10 ENCOUNTER — Other Ambulatory Visit: Payer: Self-pay | Admitting: Family Medicine

## 2022-09-10 DIAGNOSIS — Z1231 Encounter for screening mammogram for malignant neoplasm of breast: Secondary | ICD-10-CM

## 2022-10-07 ENCOUNTER — Ambulatory Visit
Admission: RE | Admit: 2022-10-07 | Discharge: 2022-10-07 | Disposition: A | Discharge status: Home / Self Care | Payer: 59 | Source: Ambulatory Visit | Attending: Family Medicine | Admitting: Family Medicine

## 2022-10-07 DIAGNOSIS — Z1231 Encounter for screening mammogram for malignant neoplasm of breast: Secondary | ICD-10-CM | POA: Diagnosis not present

## 2022-11-10 DIAGNOSIS — L570 Actinic keratosis: Secondary | ICD-10-CM | POA: Diagnosis not present

## 2022-11-10 DIAGNOSIS — L573 Poikiloderma of Civatte: Secondary | ICD-10-CM | POA: Diagnosis not present

## 2022-11-16 ENCOUNTER — Ambulatory Visit (INDEPENDENT_AMBULATORY_CARE_PROVIDER_SITE_OTHER): Payer: 59 | Admitting: Nurse Practitioner

## 2022-11-16 ENCOUNTER — Encounter: Payer: Self-pay | Admitting: Nurse Practitioner

## 2022-11-16 VITALS — BP 92/62 | HR 51 | Ht 63.0 in | Wt 135.0 lb

## 2022-11-16 DIAGNOSIS — N898 Other specified noninflammatory disorders of vagina: Secondary | ICD-10-CM | POA: Diagnosis not present

## 2022-11-16 DIAGNOSIS — N951 Menopausal and female climacteric states: Secondary | ICD-10-CM

## 2022-11-16 DIAGNOSIS — Z01419 Encounter for gynecological examination (general) (routine) without abnormal findings: Secondary | ICD-10-CM | POA: Diagnosis not present

## 2022-11-16 DIAGNOSIS — Z30431 Encounter for routine checking of intrauterine contraceptive device: Secondary | ICD-10-CM | POA: Diagnosis not present

## 2022-11-16 MED ORDER — ESTRADIOL 0.1 MG/GM VA CREA: 1.0000 g | TOPICAL_CREAM | VAGINAL | 2 refills | Status: DC

## 2022-11-16 NOTE — Progress Notes (Unsigned)
Valerie Jones 1970-12-24 [id number]   History:  51 y.o. G2P2002 presents for annual exam. Complains of occasional hot flashes, insomnia, brain fog and vaginal dryness. Mirena IUD inserted 11/2016, amenorrheic. Normal pap and mammogram history.    Gynecologic History No LMP recorded. (Menstrual status: IUD).   Contraception: IUD Sexually active: Yes  Health maintenance Last Pap: 11/11/2020. Results were: Normal, 3-year repeat Last mammogram: 10/07/2022. Results were: Normal Last colonoscopy: 03/06/2020. Results were: Polyps, 3-year recall Last Dexa: Not indicated  Past medical history, past surgical history, family history and social history were all reviewed and documented in the EPIC chart. Married. 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 Sister diagnosed with ER+ breast cancer at age 37, BRCA negative.   ROS:  A ROS was performed and pertinent positives and negatives are included.  Exam:  Vitals:   11/16/22 1606  BP: 92/62  Pulse: (!) 51  SpO2: 97%  Weight: 135 lb (61.2 kg)  Height: _0  (1.6 m)     Body mass index is 23.91 kg/m.  General appearance:  Normal Thyroid:  Symmetrical, normal in size, without palpable masses or nodularity. Respiratory  Auscultation:  Clear without wheezing or rhonchi Cardiovascular  Auscultation:  Regular rate, without rubs, murmurs or gallops  Edema/varicosities:  Not grossly evident Abdominal  Soft,nontender, without masses, guarding or rebound.  Liver/spleen:  No organomegaly noted  Hernia:  None appreciated  Skin  Inspection:  Grossly normal Breasts: Examined lying and sitting.   Right: Without masses, retractions, discharge or axillary adenopathy.   Left: Without masses, retractions, discharge or axillary adenopathy. Genitourinary   Inguinal/mons:  Normal without inguinal adenopathy  External genitalia:  Normal appearing vulva with  no masses, tenderness, or lesions  BUS/Urethra/Skene's glands:  Normal  Vagina:  Normal appearing with normal color and discharge, no lesions  Cervix:  Normal appearing without discharge or lesions. IUD string visible  Uterus:  Normal in size, shape and contour.  Midline and mobile, nontender  Adnexa/parametria:     Rt: Normal in size, without masses or tenderness.   Lt: Normal in size, without masses or tenderness.  Anus and perineum: Normal, non-bleeding hemorrhoids  Digital rectal exam: Normal sphincter tone without palpated masses or tenderness  Patient informed chaperone available to be present for breast and pelvic exam. Patient has requested no chaperone to be present. Patient has been advised what will be completed during breast and pelvic exam.   Assessment/Plan:  51 y.o. [id number] for annual exam.   Well female exam with routine gynecological exam - Plan: CBC with Differential/Platelet, Comprehensive metabolic panel. Education provided on SBEs, importance of preventative screenings, current guidelines, high calcium diet, regular exercise, and multivitamin daily.   **mag + neuro mag, OTC supp, vag estrogen. Talked about HRT Encounter for routine checking of intrauterine contraceptive device (IUD) - Amenorrheic.  Mirena IUD inserted 11/2016.  She is aware of 8-year FDA approval.   Vaginal dryness - Plan: estradiol (ESTRACE VAGINAL) 0.1 MG/GM vaginal cream twice weekly. Aware of proper use and risk of small amount of systemic absorption.   Perimenopausal symptoms - brain fog, insomnia, occasional hot flashes, vaginal dryness. We discussed menopause and what to expect. Management options reviewed with OTC supplements, lifestyle modifications, and HRT. Provided list of OTC supplements, recommend starting magnesium threonate for brain fog. Sister with history of ER+ breast cancer at age 43, BRCA negative.  Screening for cervical  cancer - Normal Pap history.  Will repeat at 3-year interval  per guidelines.  Screening for breast cancer - Normal mammogram history.  Continue annual screenings.  Normal breast exam today.   Screening for colon cancer - 2021 colonoscopy. Will repeat at GI's recommended interval.   Follow-up in 1 year for annual.      Tamela Gammon [facility], 7:37 AM 11/17/2022

## 2022-11-17 LAB — CBC WITH DIFFERENTIAL/PLATELET
Absolute Monocytes: 504 cells/uL (ref 200–950)
Basophils Absolute: 42 cells/uL (ref 0–200)
Basophils Relative: 0.7 %
Eosinophils Absolute: 60 cells/uL (ref 15–500)
Eosinophils Relative: 1 %
HCT: 37.6 % (ref 35.0–45.0)
Hemoglobin: 12.8 g/dL (ref 11.7–15.5)
Lymphs Abs: 1596 cells/uL (ref 850–3900)
MCH: 31.9 pg (ref 27.0–33.0)
MCHC: 34 g/dL (ref 32.0–36.0)
MCV: 93.8 fL (ref 80.0–100.0)
MPV: 10.3 fL (ref 7.5–12.5)
Monocytes Relative: 8.4 %
Neutro Abs: 3798 cells/uL (ref 1500–7800)
Neutrophils Relative %: 63.3 %
Platelets: 302 10*3/uL (ref 140–400)
RBC: 4.01 10*6/uL (ref 3.80–5.10)
RDW: 12.3 % (ref 11.0–15.0)
Total Lymphocyte: 26.6 %
WBC: 6 10*3/uL (ref 3.8–10.8)

## 2022-11-17 LAB — COMPREHENSIVE METABOLIC PANEL
AG Ratio: 1.8 (calc) (ref 1.0–2.5)
ALT: 15 U/L (ref 6–29)
AST: 17 U/L (ref 10–35)
Albumin: 4.4 g/dL (ref 3.6–5.1)
Alkaline phosphatase (APISO): 44 U/L (ref 37–153)
BUN: 21 mg/dL (ref 7–25)
CO2: 30 mmol/L (ref 20–32)
Calcium: 9.6 mg/dL (ref 8.6–10.4)
Chloride: 101 mmol/L (ref 98–110)
Creat: 0.84 mg/dL (ref 0.50–1.03)
Globulin: 2.4 g/dL (calc) (ref 1.9–3.7)
Glucose, Bld: 98 mg/dL (ref 65–99)
Potassium: 4 mmol/L (ref 3.5–5.3)
Sodium: 137 mmol/L (ref 135–146)
Total Bilirubin: 0.3 mg/dL (ref 0.2–1.2)
Total Protein: 6.8 g/dL (ref 6.1–8.1)

## 2022-11-18 ENCOUNTER — Encounter: Payer: 59 | Admitting: Family

## 2022-12-09 DIAGNOSIS — L573 Poikiloderma of Civatte: Secondary | ICD-10-CM | POA: Diagnosis not present

## 2022-12-09 DIAGNOSIS — D2271 Melanocytic nevi of right lower limb, including hip: Secondary | ICD-10-CM | POA: Diagnosis not present

## 2022-12-09 DIAGNOSIS — D2261 Melanocytic nevi of right upper limb, including shoulder: Secondary | ICD-10-CM | POA: Diagnosis not present

## 2022-12-09 DIAGNOSIS — L819 Disorder of pigmentation, unspecified: Secondary | ICD-10-CM | POA: Diagnosis not present

## 2022-12-09 DIAGNOSIS — D2239 Melanocytic nevi of other parts of face: Secondary | ICD-10-CM | POA: Diagnosis not present

## 2022-12-09 DIAGNOSIS — D225 Melanocytic nevi of trunk: Secondary | ICD-10-CM | POA: Diagnosis not present

## 2022-12-09 DIAGNOSIS — D485 Neoplasm of uncertain behavior of skin: Secondary | ICD-10-CM | POA: Diagnosis not present

## 2022-12-09 DIAGNOSIS — D224 Melanocytic nevi of scalp and neck: Secondary | ICD-10-CM | POA: Diagnosis not present

## 2022-12-09 DIAGNOSIS — D2272 Melanocytic nevi of left lower limb, including hip: Secondary | ICD-10-CM | POA: Diagnosis not present

## 2022-12-21 ENCOUNTER — Encounter: Payer: 59 | Admitting: Family

## 2022-12-28 DIAGNOSIS — L988 Other specified disorders of the skin and subcutaneous tissue: Secondary | ICD-10-CM | POA: Diagnosis not present

## 2022-12-28 DIAGNOSIS — D485 Neoplasm of uncertain behavior of skin: Secondary | ICD-10-CM | POA: Diagnosis not present

## 2023-01-01 ENCOUNTER — Encounter: Payer: Self-pay | Admitting: Family

## 2023-01-04 DIAGNOSIS — H9313 Tinnitus, bilateral: Secondary | ICD-10-CM | POA: Diagnosis not present

## 2023-01-04 DIAGNOSIS — H903 Sensorineural hearing loss, bilateral: Secondary | ICD-10-CM | POA: Diagnosis not present

## 2023-02-01 NOTE — Progress Notes (Incomplete)
Subjective:   By signing my name below, I, Valerie Jones, attest that this documentation has been prepared under the direction and in the presence of Nance Pear, NP 02/02/23   Patient ID: Valerie Jones, female    DOB: 1971/10/25, 52 y.o.   MRN: [id number]  Chief Complaint  Patient presents with   Annual Exam    HPI Patient is in today for a comprehensive physical exam.   Neuro: She last saw Dr. Willaim Rayas in 12/12/2019 after an episode of visual deficits lasting about 20 minutes. It has not occurred since. She also reports in 10/2022 she had an episode of short and long term memory loss. She was at the gym when this occurred and suddenly was unable to remember what she had planned for the rest of the day. She began quizzing herself and couldn't recall moments of her life like the year she got married. Afterwards she went home to rest and recalls about 2 hours of "fuzziness" before symptoms resolved. She reports she has not had any similar episodes since.   Insomnia: She endorses trouble sleeping and brain fog. She has started taking magnesium supplements. She has been trying to get to bed earlier and get at least 7 hours of sleep. She reports that she sometimes has to get up in the middle of the night to use the bathroom. She has tried to limit fluids before bed.   Moles: She follows up with a dermatologist. She reports a couple of moles that she recently had removed.   Acute: She denies having any fever, new muscle pain, new joint pain, congestion, sinus pain, sore throat, chest pain, palpitations, cough, SOB, wheezing, n/v/d, constipation, blood in stool, dysuria, frequency, hematuria, or headaches at this time.  Immunizations: She is UTD on the flu vaccine. She received the first 2 Covid boosters. She has not received the shingles vaccine, but is interested in receiving it when she has a couple days off work.   Diet: She is not on a specific diet but tends to eat relatively  healthy. She has cut her alcohol consumption. She will occasionally have a couple of drinks on a Saturday.   Exercise: She is staying active and exercises about 3 times a week.   Colonoscopy: Last on 03/06/2020. Results were: Polyps found and removed.  Mammogram: Last on 10/07/2022. Results were normal.   Pap Smear: She follows up with Marny Lowenstein, NP for gynecology. Last pap was in 2021 per GYN note. It was done at an outside system but was reported as normal.   Dental: She is UTD on routine dental exam.   Vision: She is UTD on routine vision exam  Past Medical History:  Diagnosis Date   Abdominal pain 12/21/2016   Acid reflux 08/16/2017   Anxiety 03/24/2018   ASCUS of cervix with negative high risk HPV 03/2017   GERD (gastroesophageal reflux disease)    H/O atopic dermatitis 03/17/2015   Hemorrhoids    History of chicken pox    Post-operative nausea and vomiting    Preventative health care 12/21/2016   Urinary incontinence 12/21/2016   Vitamin D deficiency 12/21/2016    Past Surgical History:  Procedure Laterality Date   APPENDECTOMY  1988   COLONOSCOPY  03/06/2020   INTRAUTERINE DEVICE INSERTION  11/13/2016   MIRENA    Family History  Problem Relation Age of Onset   Hypertension Father    Stroke Father    Atrial fibrillation Father    Dementia Father  Cancer Maternal Grandfather        Non Hodgkins Lymphoma   Hypertension Sister    Other Sister        prediabetes   Breast cancer Sister 76   Cancer Sister        breast   Mental illness Mother        anxiety   Irritable bowel syndrome Mother    Dementia Mother    Colon polyps Mother    COPD Maternal Grandmother        smoker   Colon cancer Neg Hx    Esophageal cancer Neg Hx    Stomach cancer Neg Hx    Rectal cancer Neg Hx     Social History   Socioeconomic History   Marital status: Married    Spouse name: Not on file   Number of children: Not on file   Years of education: Not on file   Highest  education level: Not on file  Occupational History   Not on file  Tobacco Use   Smoking status: Never   Smokeless tobacco: Never  Vaping Use   Vaping Use: Never used  Substance and Sexual Activity   Alcohol use: Yes    Alcohol/week: 2.0 standard drinks of alcohol    Types: 2 Glasses of wine per week    Comment: socially   Drug use: No   Sexual activity: Yes    Partners: Male    Birth control/protection: I.U.D.    Comment: Mirena inserted 11/13/2016,,Pt declined sexual Hx   Other Topics Concern   Not on file  Social History Narrative   Works as a Electrical engineer at [employer]   Married   2 children- (both sons) one at home and one has moved out   Enjoys reading, gardening, walking, running   2 dogs      Social Determinants of Radio broadcast assistant Strain: Not on file  Food Insecurity: Not on file  Transportation Needs: Not on file  Physical Activity: Not on file  Stress: Not on file  Social Connections: Not on file  Intimate Partner Violence: Not on file    Outpatient Medications Prior to Visit  Medication Sig Dispense Refill   levonorgestrel (MIRENA) 20 MCG/24HR IUD 1 each by Intrauterine route once.     VITAMIN D PO Take 1,000 units of lipase by mouth.     estradiol (ESTRACE VAGINAL) 0.1 MG/GM vaginal cream Place 1 g vaginally 2 (two) times a week. (Patient not taking: Reported on 02/02/2023) 42.5 g 2   No facility-administered medications prior to visit.    Allergies  Allergen Reactions   Clindamycin/Lincomycin Hives   Sulfa Antibiotics Hives    ROS See HPI    Objective:    Physical Exam  BP (!) 93/44 (BP Location: Right Arm, Patient Position: Sitting, Cuff Size: Small)   Pulse (!) 51   Temp 98.2 F (36.8 C) (Oral)   Resp 16   Ht 5' 4"$  (1.626 m)   Wt 134 lb (60.8 kg)   SpO2 97%   BMI 23.00 kg/m  Wt Readings from Last 3 Encounters:  02/02/23 134 lb (60.8 kg)  11/16/22 135 lb (61.2 kg)  06/01/22 133 lb 3.2 oz (60.4 kg)  Physical Exam   Constitutional: She is oriented to person, place, and time. She appears well-developed and well-nourished. No distress.  HENT:  Head: Normocephalic and atraumatic.  Right Ear: Tympanic membrane and ear canal normal.  Left Ear: Tympanic membrane and ear canal  normal.  Mouth/Throat: Oropharynx is clear and moist.  Eyes: Pupils are equal, round, and reactive to light. No scleral icterus.  Neck: Normal range of motion. No thyromegaly present.  Cardiovascular: Normal rate and regular rhythm.   No murmur heard. Pulmonary/Chest: Effort normal and breath sounds normal. No respiratory distress. He has no wheezes. She has no rales. She exhibits no tenderness.  Abdominal: Soft. Bowel sounds are normal. She exhibits no distension and no mass. There is no tenderness. There is no rebound and no guarding.  Musculoskeletal: She exhibits no edema.  Lymphadenopathy:    She has no cervical adenopathy.  Neurological: She is alert and oriented to person, place, and time. She has normal patellar reflexes. She exhibits normal muscle tone. Coordination normal.  Skin: Skin is warm and dry.  Psychiatric: She has a normal mood and affect. Her behavior is normal. Judgment and thought content normal.  Breast/pelvic: deferred          Assessment & Plan:        Assessment & Plan:  Preventative health care Assessment & Plan: Continue healthy diet, exercise.  She will get her shingrix at the pharmacy- request pap from GYN.  Mammo and colo up to date.  We did discuss her memory loss episode that she had last fall. She had an extensive stroke evaluation back in 2020 which was negative.  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  We discussed that this latest episode could have been TIA related.  Discussed possibility of Aspirin 59m and follow up with Neuro. She declines at this time but would consider if recurrent symptoms.   Other orders -     Shingrix;  Inject 0.5110mIM now and again in 2-6 months.  Dispense: 0.5 mL; Refill: 1     I,Rachel Rivera,acting as a scribe for MeNance PearNP.,have documented all relevant documentation on the behalf of MeNance PearNP,as directed by  MeNance PearNP while in the presence of MeNance PearNP.   I, MeNance PearNP, personally preformed the services described in this documentation.  All medical record entries made by the scribe were at my direction and in my presence.  I have reviewed the chart and discharge instructions (if applicable) and agree that the record reflects my personal performance and is accurate and complete. 02/02/23   MeNance PearNP

## 2023-02-02 ENCOUNTER — Ambulatory Visit (INDEPENDENT_AMBULATORY_CARE_PROVIDER_SITE_OTHER): Payer: 59 | Admitting: Family

## 2023-02-02 ENCOUNTER — Other Ambulatory Visit (HOSPITAL_BASED_OUTPATIENT_CLINIC_OR_DEPARTMENT_OTHER): Payer: Self-pay

## 2023-02-02 ENCOUNTER — Encounter: Payer: Self-pay | Admitting: Family

## 2023-02-02 ENCOUNTER — Telehealth: Payer: Self-pay | Admitting: Family

## 2023-02-02 VITALS — BP 93/44 | HR 51 | Temp 98.2°F | Resp 16 | Ht 64.0 in | Wt 134.0 lb

## 2023-02-02 DIAGNOSIS — Z0000 Encounter for general adult medical examination without abnormal findings: Secondary | ICD-10-CM

## 2023-02-02 MED ORDER — SHINGRIX 50 MCG/0.5ML IM SUSR
INTRAMUSCULAR | 1 refills | Status: AC
  Filled 2023-02-02: qty 0.5, 1d supply, fill #0

## 2023-02-02 NOTE — Assessment & Plan Note (Addendum)
Continue healthy diet, exercise.  She will get her shingrix at the pharmacy- request pap from GYN.  Mammo and colo up to date.  We did discuss her memory loss episode that she had last fall. She had an extensive stroke evaluation back in 2020 which was negative.  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  We discussed that this latest episode could have been TIA related.  Discussed possibility of Aspirin 38m and follow up with Neuro. She declines at this time but would consider if recurrent symptoms.

## 2023-02-02 NOTE — Telephone Encounter (Signed)
Opened in error

## 2023-05-05 ENCOUNTER — Other Ambulatory Visit (HOSPITAL_BASED_OUTPATIENT_CLINIC_OR_DEPARTMENT_OTHER): Payer: Self-pay

## 2023-05-05 DIAGNOSIS — H5213 Myopia, bilateral: Secondary | ICD-10-CM | POA: Diagnosis not present

## 2023-05-05 MED ORDER — GATIFLOXACIN 0.5 % OP SOLN
OPHTHALMIC | 1 refills | Status: DC
  Filled 2023-05-05: qty 2.5, 12d supply, fill #0

## 2023-05-12 ENCOUNTER — Other Ambulatory Visit (HOSPITAL_BASED_OUTPATIENT_CLINIC_OR_DEPARTMENT_OTHER): Payer: Self-pay

## 2023-06-03 DIAGNOSIS — T1502XA Foreign body in cornea, left eye, initial encounter: Secondary | ICD-10-CM | POA: Diagnosis not present

## 2023-06-03 DIAGNOSIS — H5712 Ocular pain, left eye: Secondary | ICD-10-CM | POA: Diagnosis not present

## 2023-09-13 ENCOUNTER — Other Ambulatory Visit: Payer: Self-pay | Admitting: Nurse Practitioner

## 2023-09-13 DIAGNOSIS — Z1231 Encounter for screening mammogram for malignant neoplasm of breast: Secondary | ICD-10-CM

## 2023-09-29 ENCOUNTER — Encounter: Payer: Self-pay | Admitting: Nurse Practitioner

## 2023-09-29 ENCOUNTER — Ambulatory Visit: Payer: 59 | Admitting: Nurse Practitioner

## 2023-09-29 ENCOUNTER — Other Ambulatory Visit (HOSPITAL_BASED_OUTPATIENT_CLINIC_OR_DEPARTMENT_OTHER): Payer: Self-pay

## 2023-09-29 VITALS — BP 120/68 | HR 60

## 2023-09-29 DIAGNOSIS — R351 Nocturia: Secondary | ICD-10-CM | POA: Diagnosis not present

## 2023-09-29 DIAGNOSIS — N39492 Postural (urinary) incontinence: Secondary | ICD-10-CM

## 2023-09-29 DIAGNOSIS — N764 Abscess of vulva: Secondary | ICD-10-CM | POA: Diagnosis not present

## 2023-09-29 MED ORDER — MUPIROCIN 2 % EX OINT
1.0000 | TOPICAL_OINTMENT | Freq: Two times a day (BID) | CUTANEOUS | 0 refills | Status: DC
  Filled 2023-09-29: qty 22, 11d supply, fill #0

## 2023-09-29 NOTE — Progress Notes (Signed)
   Acute Office Visit  Subjective:    Patient ID: Valerie Jones, female    DOB: 1971/07/03, 52 y.o.   MRN: [id number]   HPI 52 y.o. presents today for labial bump. First noticed it months ago but feels it is larger and more painful this past week. Went on a week long back packing trip and thinks this caused it to get worse. Also reports urinary incontinence twice in the past week and a half. Voids and then after ambulating for a minute fully empties bladder. No sense of urgency prior to incontinence. Denies dysuria, frequency, hematuria or back pain. Has had to double void for years but this is new for her. Has also been urinating more at night. Prescribed vaginal estrogen but not using.   No LMP recorded. (Menstrual status: IUD).    Review of Systems  Constitutional: Negative.   Genitourinary:  Positive for difficulty urinating and genital sores. Negative for dysuria, flank pain, frequency, hematuria, pelvic pain and urgency.       Objective:    Physical Exam Constitutional:      Appearance: Normal appearance.  Genitourinary:      BP 120/68 (BP Location: Left Arm, Patient Position: Sitting, Cuff Size: Normal)   Pulse 60   SpO2 99%  Wt Readings from Last 3 Encounters:  02/02/23 134 lb (60.8 kg)  11/16/22 135 lb (61.2 kg)  06/01/22 133 lb 3.2 oz (60.4 kg)        Patient informed chaperone available to be present for breast and/or pelvic exam. Patient has requested no chaperone to be present. Patient has been advised what will be completed during breast and pelvic exam.   UA negative  Assessment & Plan:   Problem List Items Addressed This Visit       Other   Urinary incontinence - Primary   Relevant Orders   Urinalysis,Complete w/RFL Culture   Ambulatory referral to Urology   Other Visit Diagnoses     Vulvar abscess       Relevant Medications   mupirocin ointment (BACTROBAN) 2 %   Nocturia       Relevant Orders   Ambulatory referral to Urology       Plan: Partially drained cystic area on left labia. Mild redness just at site. Serosanguinous drainage. Apply Bactroban BID x 7-10 days. Urology referral recommended. UA negative.   Return if symptoms worsen or fail to improve.    Olivia Mackie DNP, 4:26 PM 09/29/2023

## 2023-09-30 LAB — URINALYSIS, COMPLETE W/RFL CULTURE
Bacteria, UA: NONE SEEN /HPF
Bilirubin Urine: NEGATIVE
Glucose, UA: NEGATIVE
Hyaline Cast: NONE SEEN /LPF
Ketones, ur: NEGATIVE
Leukocyte Esterase: NEGATIVE
Nitrites, Initial: NEGATIVE
Protein, ur: NEGATIVE
Specific Gravity, Urine: 1.01 (ref 1.001–1.035)
WBC, UA: NONE SEEN /HPF (ref 0–5)
pH: 5 (ref 5.0–8.0)

## 2023-09-30 LAB — URINE CULTURE
MICRO NUMBER:: 15602559
Result:: NO GROWTH
SPECIMEN QUALITY:: ADEQUATE

## 2023-09-30 LAB — CULTURE INDICATED

## 2023-10-08 DIAGNOSIS — R351 Nocturia: Secondary | ICD-10-CM | POA: Diagnosis not present

## 2023-10-08 DIAGNOSIS — N3946 Mixed incontinence: Secondary | ICD-10-CM | POA: Diagnosis not present

## 2023-10-11 ENCOUNTER — Ambulatory Visit
Admission: RE | Admit: 2023-10-11 | Discharge: 2023-10-11 | Disposition: A | Discharge status: Home / Self Care | Payer: 59 | Source: Ambulatory Visit | Attending: Nurse Practitioner | Admitting: Nurse Practitioner

## 2023-10-11 DIAGNOSIS — Z1231 Encounter for screening mammogram for malignant neoplasm of breast: Secondary | ICD-10-CM | POA: Diagnosis not present

## 2023-12-28 ENCOUNTER — Ambulatory Visit (INDEPENDENT_AMBULATORY_CARE_PROVIDER_SITE_OTHER): Payer: 59 | Admitting: Nurse Practitioner

## 2023-12-28 ENCOUNTER — Other Ambulatory Visit (HOSPITAL_COMMUNITY)
Admission: RE | Admit: 2023-12-28 | Discharge: 2023-12-28 | Disposition: A | Discharge status: Home / Self Care | Payer: 59 | Source: Ambulatory Visit | Attending: Nurse Practitioner | Admitting: Nurse Practitioner

## 2023-12-28 ENCOUNTER — Encounter: Payer: Self-pay | Admitting: Nurse Practitioner

## 2023-12-28 VITALS — BP 102/68 | HR 45 | Ht 63.0 in | Wt 134.0 lb

## 2023-12-28 DIAGNOSIS — R42 Dizziness and giddiness: Secondary | ICD-10-CM

## 2023-12-28 DIAGNOSIS — Z01419 Encounter for gynecological examination (general) (routine) without abnormal findings: Secondary | ICD-10-CM

## 2023-12-28 DIAGNOSIS — N39492 Postural (urinary) incontinence: Secondary | ICD-10-CM

## 2023-12-28 DIAGNOSIS — Z124 Encounter for screening for malignant neoplasm of cervix: Secondary | ICD-10-CM

## 2023-12-28 DIAGNOSIS — Z30431 Encounter for routine checking of intrauterine contraceptive device: Secondary | ICD-10-CM

## 2023-12-28 DIAGNOSIS — E559 Vitamin D deficiency, unspecified: Secondary | ICD-10-CM | POA: Diagnosis not present

## 2023-12-28 NOTE — Progress Notes (Signed)
 Valerie Jones 02-27-1971 [id number]   History:  53 y.o. G2P2002 presents for annual exam. Complains of feeling lightheaded when she stands for the past week. No other associated symptoms. Has occasional hot flashes. Mirena  IUD inserted 11/2016, amenorrheic. Normal pap and mammogram history. Referred to urology in October due to postural incontinence. Planning to start pelvic floor PT. Prescribed vaginal estrogen last year but never started.  Gynecologic History No LMP recorded. (Menstrual status: IUD).   Contraception: IUD Sexually active: Yes  Health maintenance Last Pap: 11/11/2020. Results were: Normal Last mammogram: 10/11/2023. Results were: Normal Last colonoscopy: 03/06/2020. Results were: Polyps, 3-year recall Last Dexa: Not indicated  Past medical history, past surgical history, family history and social history were all reviewed and documented in the EPIC chart. Married. 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 Sister diagnosed with ER+ breast cancer at age 25, BRCA negative.   ROS:  A ROS was performed and pertinent positives and negatives are included.  Exam:  Vitals:   12/28/23 1611  BP: 102/68  Pulse: (!) 45  SpO2: 98%  Weight: 134 lb (60.8 kg)  Height: 5' 3 (1.6 m)      Body mass index is 23.74 kg/m.  General appearance:  Normal Thyroid :  Symmetrical, normal in size, without palpable masses or nodularity. Respiratory  Auscultation:  Clear without wheezing or rhonchi Cardiovascular  Auscultation:  Bradycardia, normal , without rubs, murmurs or gallops  Edema/varicosities:  Not grossly evident Abdominal  Soft,nontender, without masses, guarding or rebound.  Liver/spleen:  No organomegaly noted  Hernia:  None appreciated  Skin  Inspection:  Grossly normal Breasts: Examined lying and sitting.   Right: Without masses, retractions, discharge or axillary  adenopathy.   Left: Without masses, retractions, discharge or axillary adenopathy. Pelvic: External genitalia:  no lesions              Urethra:  normal appearing urethra with no masses, tenderness or lesions              Bartholins and Skenes: normal                 Vagina: normal appearing vagina with normal color and discharge, no lesions              Cervix: no lesions. IUD strings visible Bimanual Exam:  Uterus:  no masses or tenderness              Adnexa: no mass, fullness, tenderness              Rectovaginal: Deferred              Anus:  normal, no lesions  Patient informed chaperone available to be present for breast and pelvic exam. Patient has requested no chaperone to be present. Patient has been advised what will be completed during breast and pelvic exam.   Assessment/Plan:  53 y.o. [id number] for annual exam.   Well female exam with routine gynecological exam - Plan: CBC with Differential/Platelet, Comprehensive metabolic panel. Education provided on SBEs, importance of preventative screenings, current guidelines, high calcium  diet, regular exercise, and multivitamin daily.   Encounter for routine checking of intrauterine contraceptive device (IUD) - Amenorrheic.  Mirena  IUD inserted 11/2016. She is aware of 8-year FDA approval.  Cervical cancer screening - Plan: Cytology - PAP( [place]). Normal pap history.   Dizziness - Plan: TSH. HR 45 today.  Reports usually upper 40s/lower 50s. BP 102/68, normal per her. If labs normal she will follow up with PCP.   Vitamin D  deficiency - Plan: VITAMIN D  25 Hydroxy (Vit-D Deficiency, Fractures)  Postural urinary incontinence - Referred to urology in October due to postural incontinence. Planning to start pelvic floor PT. Prescribed vaginal estrogen last year but never started. Will consider it no improvement with PT.   Screening for breast cancer - Normal mammogram history.  Continue annual screenings.  Normal breast exam today.    Screening for colon cancer - 2021 colonoscopy. Will repeat at GI's recommended interval.   Return in about 1 year (around 12/27/2024) for Annual.      Valerie Jones [facility], 4:39 PM 12/28/2023

## 2023-12-29 ENCOUNTER — Other Ambulatory Visit: Payer: Self-pay | Admitting: Nurse Practitioner

## 2023-12-29 DIAGNOSIS — R7989 Other specified abnormal findings of blood chemistry: Secondary | ICD-10-CM

## 2023-12-29 LAB — COMPREHENSIVE METABOLIC PANEL
AG Ratio: 2.2 (calc) (ref 1.0–2.5)
ALT: 11 U/L (ref 6–29)
AST: 13 U/L (ref 10–35)
Albumin: 4.4 g/dL (ref 3.6–5.1)
Alkaline phosphatase (APISO): 46 U/L (ref 37–153)
BUN/Creatinine Ratio: 16 (calc) (ref 6–22)
BUN: 24 mg/dL (ref 7–25)
CO2: 25 mmol/L (ref 20–32)
Calcium: 9.2 mg/dL (ref 8.6–10.4)
Chloride: 103 mmol/L (ref 98–110)
Creat: 1.47 mg/dL — ABNORMAL HIGH (ref 0.50–1.03)
Globulin: 2 g/dL (ref 1.9–3.7)
Glucose, Bld: 91 mg/dL (ref 65–99)
Potassium: 4.1 mmol/L (ref 3.5–5.3)
Sodium: 138 mmol/L (ref 135–146)
Total Bilirubin: 0.3 mg/dL (ref 0.2–1.2)
Total Protein: 6.4 g/dL (ref 6.1–8.1)

## 2023-12-29 LAB — CBC WITH DIFFERENTIAL/PLATELET
Absolute Lymphocytes: 1800 {cells}/uL (ref 850–3900)
Absolute Monocytes: 454 {cells}/uL (ref 200–950)
Basophils Absolute: 41 {cells}/uL (ref 0–200)
Basophils Relative: 0.8 %
Eosinophils Absolute: 112 {cells}/uL (ref 15–500)
Eosinophils Relative: 2.2 %
HCT: 38.9 % (ref 35.0–45.0)
Hemoglobin: 12.7 g/dL (ref 11.7–15.5)
MCH: 31 pg (ref 27.0–33.0)
MCHC: 32.6 g/dL (ref 32.0–36.0)
MCV: 94.9 fL (ref 80.0–100.0)
MPV: 10.1 fL (ref 7.5–12.5)
Monocytes Relative: 8.9 %
Neutro Abs: 2693 {cells}/uL (ref 1500–7800)
Neutrophils Relative %: 52.8 %
Platelets: 299 10*3/uL (ref 140–400)
RBC: 4.1 10*6/uL (ref 3.80–5.10)
RDW: 11.9 % (ref 11.0–15.0)
Total Lymphocyte: 35.3 %
WBC: 5.1 10*3/uL (ref 3.8–10.8)

## 2023-12-29 LAB — TSH: TSH: 1.95 m[IU]/L

## 2023-12-29 LAB — VITAMIN D 25 HYDROXY (VIT D DEFICIENCY, FRACTURES): Vit D, 25-Hydroxy: 28 ng/mL — ABNORMAL LOW (ref 30–100)

## 2023-12-31 LAB — CYTOLOGY - PAP
Comment: NEGATIVE
Diagnosis: NEGATIVE
Diagnosis: REACTIVE
High risk HPV: NEGATIVE

## 2024-01-04 ENCOUNTER — Encounter: Payer: Self-pay | Admitting: Nurse Practitioner

## 2024-01-06 ENCOUNTER — Encounter: Payer: Self-pay | Admitting: Gastroenterology

## 2024-01-17 ENCOUNTER — Encounter: Payer: Self-pay | Admitting: Family Medicine

## 2024-01-20 ENCOUNTER — Encounter: Payer: Self-pay | Admitting: Family Medicine

## 2024-01-20 ENCOUNTER — Ambulatory Visit (INDEPENDENT_AMBULATORY_CARE_PROVIDER_SITE_OTHER): Payer: 59 | Admitting: Family Medicine

## 2024-01-20 VITALS — BP 102/76 | Ht 64.0 in | Wt 130.0 lb

## 2024-01-20 DIAGNOSIS — M7601 Gluteal tendinitis, right hip: Secondary | ICD-10-CM

## 2024-01-20 DIAGNOSIS — M76891 Other specified enthesopathies of right lower limb, excluding foot: Secondary | ICD-10-CM | POA: Diagnosis not present

## 2024-01-20 NOTE — Progress Notes (Signed)
 PCP: Domenica Harlene LABOR, MD  SUBJECTIVE:   HPI:  Patient is a 53 y.o. female here with chief complaint of right buttock, hip, and lower back pain. She reports that she ran a spartan race in September and around that time began having right hamstring pain. Since then she has started training more heavily and has developed more buttock and lower back pain on her right side. She is doing strength training, running, and some mobility training.   She reports the pain is aching and while it affects her more during activity it is now 1/10 at rest. Does not radiate down her leg. No numbness or tingling. No weakness.  Pertinent ROS were reviewed with the patient and found to be negative unless otherwise specified above in HPI.   PERTINENT  PMH / PSH / FH / SH:  Past Medical, Surgical, Social, and Family History Reviewed & Updated in the EMR.  Pertinent findings include:    Past Medical History:  Diagnosis Date   Abdominal pain 12/21/2016   Acid reflux 08/16/2017   Anxiety 03/24/2018   ASCUS of cervix with negative high risk HPV 03/2017   GERD (gastroesophageal reflux disease)    H/O atopic dermatitis 03/17/2015   Hemorrhoids    History of chicken pox    Post-operative nausea and vomiting    Preventative health care 12/21/2016   Urinary incontinence 12/21/2016   Vitamin D  deficiency 12/21/2016    Current Outpatient Medications on File Prior to Visit  Medication Sig Dispense Refill   levonorgestrel  (MIRENA ) 20 MCG/24HR IUD 1 each by Intrauterine route once.     VITAMIN D  PO Take 1,000 units of lipase by mouth.     Zoster Vaccine Adjuvanted (SHINGRIX ) injection Inject 0.5mg  IM now and again in 2-6 months. (Patient not taking: Reported on 12/28/2023) 0.5 mL 1   No current facility-administered medications on file prior to visit.    Past Surgical History:  Procedure Laterality Date   APPENDECTOMY  1988   COLONOSCOPY  03/06/2020   INTRAUTERINE DEVICE INSERTION  11/13/2016   MIRENA     Allergies   Allergen Reactions   Clindamycin/Lincomycin Hives   Sulfa Antibiotics Hives    OBJECTIVE:  BP 102/76   Ht 5' 4 (1.626 m)   Wt 130 lb (59 kg)   BMI 22.31 kg/m   PHYSICAL EXAM:  GEN: Alert and Oriented, NAD, comfortable in exam room RESP: Unlabored respirations, symmetric chest rise PSY: normal mood, congruent affect   MSK EXAM: Lumbar spine: - Inspection: no gross deformity or scoliosis; no swelling or ecchymosis. No skin changes - Palpation: No TTP over the spinous processes. Some tenderness over right lumbar paraspinal muscles, or SI joints b/l - ROM: full active ROM of the lumbar spine in flexion and extension without pain - Strength: 4/5 strength of lower extremity in L4-S1 nerve root distributions on the right 5/5 on the left  *L1/L2: Hip Flexion & Abduction - Neuro: sensation intact in the L4-S1 nerve root distribution b/l - Provocative Testing: Negative straight leg raise  Hip, right: TTP noted at ischial tuberosity, posterior iliac crest. No obvious rash, erythema, ecchymosis, or edema. Passive Log Roll equivalent b/l without restriction. ROM full in all directions; Strength on the right 4/5 in IR/ER/Flex/Ext/Abd/Add. Pelvic alignment unremarkable to inspection and palpation. Non-antalgic gait with trendelenburg R>L. Greater trochanter without tenderness to palpation. No tenderness over piriformis. No SI joint tenderness and normal minimal SI movement.  Provocative Testing:    - FABER/FADIR test: NEG   -  Ober's test: NEG    - Thomas test: NEG   - Trendelenburg test: positive   - Hop test: NEG  Assessment & Plan Gluteal tendinitis, right hip Few months history of worsening buttock pain associated with increased training load. On exam TTP throughout gluteal muscles and trendelenburg positive R>L. Additionally TTP over ischial tuberosity. History and physical most c/w gluteal and hamstring tendinopathy. Discussed treatment options starting with physical therapy and  NSAIDs. She was amenable with this plan. Referral placed to [facility]. Will plan to follow up in 6-8 weeks or sooner if needed. All questions answered.    Demaris Freiberg MD, PGY2 [facility]

## 2024-01-20 NOTE — Patient Instructions (Addendum)
 [facility] Located inside [facility] [address] [place], [place] [phone]

## 2024-01-26 ENCOUNTER — Other Ambulatory Visit: Payer: 59

## 2024-02-07 ENCOUNTER — Ambulatory Visit (AMBULATORY_SURGERY_CENTER): Payer: 59

## 2024-02-07 VITALS — Ht 64.0 in | Wt 129.0 lb

## 2024-02-07 DIAGNOSIS — Z860100 Personal history of colon polyps, unspecified: Secondary | ICD-10-CM

## 2024-02-07 MED ORDER — SUFLAVE 178.7 G PO SOLR
1.0000 | ORAL | 0 refills | Status: DC
Start: 2024-02-07 — End: 2024-03-23

## 2024-02-07 NOTE — Progress Notes (Signed)

## 2024-02-08 ENCOUNTER — Other Ambulatory Visit: Payer: 59

## 2024-02-08 DIAGNOSIS — R7989 Other specified abnormal findings of blood chemistry: Secondary | ICD-10-CM | POA: Diagnosis not present

## 2024-02-09 ENCOUNTER — Encounter: Payer: Self-pay | Admitting: Nurse Practitioner

## 2024-02-09 LAB — COMPLETE METABOLIC PANEL WITH GFR
AG Ratio: 1.9 (calc) (ref 1.0–2.5)
ALT: 15 U/L (ref 6–29)
AST: 16 U/L (ref 10–35)
Albumin: 4.4 g/dL (ref 3.6–5.1)
Alkaline phosphatase (APISO): 45 U/L (ref 37–153)
BUN/Creatinine Ratio: 33 (calc) — ABNORMAL HIGH (ref 6–22)
BUN: 27 mg/dL — ABNORMAL HIGH (ref 7–25)
CO2: 25 mmol/L (ref 20–32)
Calcium: 9.6 mg/dL (ref 8.6–10.4)
Chloride: 102 mmol/L (ref 98–110)
Creat: 0.83 mg/dL (ref 0.50–1.03)
Globulin: 2.3 g/dL (ref 1.9–3.7)
Glucose, Bld: 86 mg/dL (ref 65–99)
Potassium: 4.1 mmol/L (ref 3.5–5.3)
Sodium: 138 mmol/L (ref 135–146)
Total Bilirubin: 0.5 mg/dL (ref 0.2–1.2)
Total Protein: 6.7 g/dL (ref 6.1–8.1)
eGFR: 84 mL/min/{1.73_m2} (ref >=60)

## 2024-02-28 ENCOUNTER — Encounter: Payer: 59 | Admitting: Gastroenterology

## 2024-03-06 DIAGNOSIS — D2271 Melanocytic nevi of right lower limb, including hip: Secondary | ICD-10-CM | POA: Diagnosis not present

## 2024-03-06 DIAGNOSIS — D225 Melanocytic nevi of trunk: Secondary | ICD-10-CM | POA: Diagnosis not present

## 2024-03-06 DIAGNOSIS — D2272 Melanocytic nevi of left lower limb, including hip: Secondary | ICD-10-CM | POA: Diagnosis not present

## 2024-03-06 DIAGNOSIS — D224 Melanocytic nevi of scalp and neck: Secondary | ICD-10-CM | POA: Diagnosis not present

## 2024-03-06 DIAGNOSIS — L573 Poikiloderma of Civatte: Secondary | ICD-10-CM | POA: Diagnosis not present

## 2024-03-06 DIAGNOSIS — D2261 Melanocytic nevi of right upper limb, including shoulder: Secondary | ICD-10-CM | POA: Diagnosis not present

## 2024-03-06 DIAGNOSIS — D2239 Melanocytic nevi of other parts of face: Secondary | ICD-10-CM | POA: Diagnosis not present

## 2024-03-13 ENCOUNTER — Encounter: Payer: 59 | Admitting: Gastroenterology

## 2024-03-21 ENCOUNTER — Encounter: Payer: Self-pay | Admitting: Certified Registered Nurse Anesthetist

## 2024-03-21 ENCOUNTER — Encounter: Payer: Self-pay | Admitting: Gastroenterology

## 2024-03-22 DIAGNOSIS — N951 Menopausal and female climacteric states: Secondary | ICD-10-CM | POA: Diagnosis not present

## 2024-03-23 ENCOUNTER — Ambulatory Visit: Admitting: Gastroenterology

## 2024-03-23 ENCOUNTER — Encounter: Payer: Self-pay | Admitting: Gastroenterology

## 2024-03-23 VITALS — BP 109/69 | HR 47 | Temp 97.3°F | Resp 10 | Ht 64.0 in | Wt 129.0 lb

## 2024-03-23 DIAGNOSIS — K635 Polyp of colon: Secondary | ICD-10-CM | POA: Diagnosis not present

## 2024-03-23 DIAGNOSIS — K648 Other hemorrhoids: Secondary | ICD-10-CM | POA: Diagnosis not present

## 2024-03-23 DIAGNOSIS — F419 Anxiety disorder, unspecified: Secondary | ICD-10-CM | POA: Diagnosis not present

## 2024-03-23 DIAGNOSIS — K644 Residual hemorrhoidal skin tags: Secondary | ICD-10-CM

## 2024-03-23 DIAGNOSIS — D123 Benign neoplasm of transverse colon: Secondary | ICD-10-CM | POA: Diagnosis not present

## 2024-03-23 DIAGNOSIS — K6289 Other specified diseases of anus and rectum: Secondary | ICD-10-CM

## 2024-03-23 DIAGNOSIS — Z1211 Encounter for screening for malignant neoplasm of colon: Secondary | ICD-10-CM | POA: Diagnosis not present

## 2024-03-23 DIAGNOSIS — Z860101 Personal history of adenomatous and serrated colon polyps: Secondary | ICD-10-CM | POA: Diagnosis not present

## 2024-03-23 DIAGNOSIS — Z860100 Personal history of colon polyps, unspecified: Secondary | ICD-10-CM

## 2024-03-23 MED ORDER — SODIUM CHLORIDE 0.9 % IV SOLN: 500.0000 mL | Freq: Once | INTRAVENOUS | Status: DC

## 2024-03-23 NOTE — Patient Instructions (Signed)
 Resume previous diet and medications.  Handouts provided on polyps and hemorrhoids.  Repeat colonoscopy in 5 years.    YOU HAD AN ENDOSCOPIC PROCEDURE TODAY AT THE [facility]:   Refer to the procedure report that was given to you for any specific questions about what was found during the examination.  If the procedure report does not answer your questions, please call your gastroenterologist to clarify.  If you requested that your care partner not be given the details of your procedure findings, then the procedure report has been included in a sealed envelope for you to review at your convenience later.  YOU SHOULD EXPECT: Some feelings of bloating in the abdomen. Passage of more gas than usual.  Walking can help get rid of the air that was put into your GI tract during the procedure and reduce the bloating. If you had a lower endoscopy (such as a colonoscopy or flexible sigmoidoscopy) you may notice spotting of blood in your stool or on the toilet paper. If you underwent a bowel prep for your procedure, you may not have a normal bowel movement for a few days.  Please Note:  You might notice some irritation and congestion in your nose or some drainage.  This is from the oxygen used during your procedure.  There is no need for concern and it should clear up in a day or so.  SYMPTOMS TO REPORT IMMEDIATELY:  Following lower endoscopy (colonoscopy or flexible sigmoidoscopy):  Excessive amounts of blood in the stool  Significant tenderness or worsening of abdominal pains  Swelling of the abdomen that is new, acute  Fever of 100F or higher  For urgent or emergent issues, a gastroenterologist can be reached at any hour by calling [phone]. Do not use MyChart messaging for urgent concerns.    DIET:  We do recommend a small meal at first, but then you may proceed to your regular diet.  Drink plenty of fluids but you should avoid alcoholic beverages for 24 hours.  ACTIVITY:  You  should plan to take it easy for the rest of today and you should NOT DRIVE or use heavy machinery until tomorrow (because of the sedation medicines used during the test).    FOLLOW UP: Our staff will call the number listed on your records the next business day following your procedure.  We will call around 7:15- 8:00 am to check on you and address any questions or concerns that you may have regarding the information given to you following your procedure. If we do not reach you, we will leave a message.     If any biopsies were taken you will be contacted by phone or by letter within the next 1-3 weeks.  Please call us at [phone] if you have not heard about the biopsies in 3 weeks.    SIGNATURES/CONFIDENTIALITY: You and/or your care partner have signed paperwork which will be entered into your electronic medical record.  These signatures attest to the fact that that the information above on your After Visit Summary has been reviewed and is understood.  Full responsibility of the confidentiality of this discharge information lies with you and/or your care-partner.

## 2024-03-23 NOTE — Progress Notes (Signed)
 1117 Ephedrine 10 mg given IV due to low BP, MD updated.

## 2024-03-23 NOTE — Progress Notes (Signed)
 Report given to PACU, vss

## 2024-03-23 NOTE — Op Note (Signed)
 [facility] Patient Name: Annebelle Bostic Procedure Date: 03/23/2024 11:01 AM MRN: [id number] Endoscopist: Napoleon Form , MD, [id number] Age: 53 Referring MD:  Date of Birth: 03-Nov-1971 Gender: Female Account #: [id number] Procedure:                Colonoscopy Indications:              High risk colon cancer surveillance: Personal                            history of adenoma (10 mm or greater in size) Medicines:                Monitored Anesthesia Care Procedure:                Pre-Anesthesia Assessment:                           - Prior to the procedure, a History and Physical                            was performed, and patient medications and                            allergies were reviewed. The patient's tolerance of                            previous anesthesia was also reviewed. The risks                            and benefits of the procedure and the sedation                            options and risks were discussed with the patient.                            All questions were answered, and informed consent                            was obtained. Prior Anticoagulants: The patient has                            taken no anticoagulant or antiplatelet agents. ASA                            Grade Assessment: II - A patient with mild systemic                            disease. After reviewing the risks and benefits,                            the patient was deemed in satisfactory condition to                            undergo the procedure.  After obtaining informed consent, the colonoscope                            was passed under direct vision. Throughout the                            procedure, the patient's blood pressure, pulse, and                            oxygen saturations were monitored continuously. The                            [id number]-HQ190L Colonoscope [id number] was introduced                            through the  anus and advanced to the the cecum,                            identified by appendiceal orifice and ileocecal                            valve. The colonoscopy was performed without                            difficulty. The patient tolerated the procedure                            well. The quality of the bowel preparation was                            good. The ileocecal valve, appendiceal orifice, and                            rectum were photographed. Scope In: 11:14:44 AM Scope Out: 11:32:15 AM Scope Withdrawal Time: 0 hours 11 minutes 37 seconds  Total Procedure Duration: 0 hours 17 minutes 31 seconds  Findings:                 The perianal and digital rectal examinations were                            normal.                           A 3 mm polyp was found in the transverse colon. The                            polyp was sessile. The polyp was removed with a                            cold snare. Resection and retrieval were complete.                           Non-bleeding external and internal hemorrhoids were  found during retroflexion. The hemorrhoids were                            medium-sized.                           Anal papilla(e) were hypertrophied. Complications:            No immediate complications. Estimated Blood Loss:     Estimated blood loss was minimal. Impression:               - One 3 mm polyp in the transverse colon, removed                            with a cold snare. Resected and retrieved.                           - Non-bleeding external and internal hemorrhoids.                           - Anal papilla(e) were hypertrophied. Recommendation:           - Patient has a contact number available for                            emergencies. The signs and symptoms of potential                            delayed complications were discussed with the                            patient. Return to normal activities tomorrow.                             Written discharge instructions were provided to the                            patient.                           - Resume previous diet.                           - Continue present medications.                           - Await pathology results.                           - Repeat colonoscopy in 5 years for surveillance. Napoleon Form, MD 03/23/2024 11:38:56 AM This report has been signed electronically.

## 2024-03-23 NOTE — Progress Notes (Signed)
 [facility] History and Physical   Primary Care Physician:  Bradd Canary, MD   Reason for Procedure:  History of adenomatous colon polyps  Plan:    Surveillance colonoscopy with possible interventions as needed     HPI: Valerie Jones is a very pleasant 53 y.o. female here for surveillance colonoscopy. Denies any nausea, vomiting, abdominal pain, melena or bright red blood per rectum  The risks and benefits as well as alternatives of endoscopic procedure(s) have been discussed and reviewed. All questions answered. The patient agrees to proceed.    Past Medical History:  Diagnosis Date   Abdominal pain 12/21/2016   Acid reflux 08/16/2017   Anxiety 03/24/2018   ASCUS of cervix with negative high risk HPV 03/2017   GERD (gastroesophageal reflux disease)    H/O atopic dermatitis 03/17/2015   Hemorrhoids    History of chicken pox    Post-operative nausea and vomiting    Preventative health care 12/21/2016   Urinary incontinence 12/21/2016   Vitamin D deficiency 12/21/2016    Past Surgical History:  Procedure Laterality Date   APPENDECTOMY  1988   COLONOSCOPY  03/06/2020   INTRAUTERINE DEVICE INSERTION  11/13/2016   MIRENA    Prior to Admission medications   Medication Sig Start Date End Date Taking? Authorizing Provider  VITAMIN D PO Take 1,000 units of lipase by mouth.   Yes [provider]  levonorgestrel (MIRENA) 20 MCG/24HR IUD 1 each by Intrauterine route once.    [provider]  Zoster Vaccine Adjuvanted [facility]) injection Inject 0.5mg  IM now and again in 2-6 months. Patient not taking: Reported on 03/23/2024 02/02/23   Sandford Craze, NP    Current Outpatient Medications  Medication Sig Dispense Refill   VITAMIN D PO Take 1,000 units of lipase by mouth.     levonorgestrel (MIRENA) 20 MCG/24HR IUD 1 each by Intrauterine route once.     Zoster Vaccine Adjuvanted [facility]) injection Inject 0.5mg  IM now and again in 2-6 months.  (Patient not taking: Reported on 03/23/2024) 0.5 mL 1   Current Facility-Administered Medications  Medication Dose Route Frequency Provider Last Rate Last Admin   0.9 %  sodium chloride infusion  500 mL Intravenous Once Napoleon Form, MD        Allergies as of 03/23/2024 - Review Complete 03/23/2024  Allergen Reaction Noted   Clindamycin/lincomycin Hives 08/07/2013   Sulfa antibiotics Hives 10/13/2011    Family History  Problem Relation Age of Onset   Hypertension Father    Stroke Father    Atrial fibrillation Father    Dementia Father    Cancer Maternal Grandfather        Non Hodgkins Lymphoma   Hypertension Sister    Other Sister        prediabetes   Breast cancer Sister 56   Cancer Sister        breast   Mental illness Mother        anxiety   Irritable bowel syndrome Mother    Dementia Mother    Colon polyps Mother    COPD Maternal Grandmother        smoker   Colon cancer Neg Hx    Esophageal cancer Neg Hx    Stomach cancer Neg Hx    Rectal cancer Neg Hx     Social History   Socioeconomic History   Marital status: Married    Spouse name: Not on file   Number of children: Not on file  Years of education: Not on file   Highest education level: Not on file  Occupational History   Not on file  Tobacco Use   Smoking status: Never   Smokeless tobacco: Never  Vaping Use   Vaping status: Never Used  Substance and Sexual Activity   Alcohol use: Yes    Alcohol/week: 2.0 standard drinks of alcohol    Types: 2 Glasses of wine per week    Comment: socially   Drug use: No   Sexual activity: Yes    Partners: Male    Birth control/protection: I.U.D.    Comment: Mirena inserted 11/13/2016,,Pt declined sexual Hx   Other Topics Concern   Not on file  Social History Narrative   Works as a Doctor, general practice at [employer]   Married   2 children- (both sons) one at home and one has moved out   Enjoys reading, gardening, walking, running   2 dogs      Social  Drivers of Corporate investment banker Strain: Not on file  Food Insecurity: Not on file  Transportation Needs: Not on file  Physical Activity: Not on file  Stress: Not on file  Social Connections: Not on file  Intimate Partner Violence: Not on file    Review of Systems:  All other review of systems negative except as mentioned in the HPI.  Physical Exam: Vital signs in last 24 hours: BP 98/61   Pulse (!) 44   Temp (!) 97.3 F (36.3 C)   Ht 5\' 4"  (1.626 m)   Wt 129 lb (58.5 kg)   SpO2 99%   BMI 22.14 kg/m  General:   Alert, NAD Lungs:  Clear .   Heart:  Regular rate and rhythm Abdomen:  Soft, nontender and nondistended. Neuro/Psych:  Alert and cooperative. Normal mood and affect. A and O x 3  Reviewed labs, radiology imaging, old records and pertinent past GI work up  Patient is appropriate for planned procedure(s) and anesthesia in an ambulatory setting   K. Scherry Ran , MD [phone]

## 2024-03-23 NOTE — Progress Notes (Signed)
 Called to room to assist during endoscopic procedure.  Patient ID and intended procedure confirmed with present staff. Received instructions for my participation in the procedure from the performing physician.

## 2024-03-24 ENCOUNTER — Telehealth: Payer: Self-pay | Admitting: *Deleted

## 2024-03-24 ENCOUNTER — Encounter: Payer: Self-pay | Admitting: Gastroenterology

## 2024-03-24 ENCOUNTER — Other Ambulatory Visit (HOSPITAL_BASED_OUTPATIENT_CLINIC_OR_DEPARTMENT_OTHER): Payer: Self-pay

## 2024-03-24 NOTE — Telephone Encounter (Signed)
 Post procedure follow up call placed, no answer and left VM.

## 2024-03-27 ENCOUNTER — Other Ambulatory Visit (HOSPITAL_BASED_OUTPATIENT_CLINIC_OR_DEPARTMENT_OTHER): Payer: Self-pay

## 2024-03-27 LAB — SURGICAL PATHOLOGY

## 2024-03-27 MED ORDER — PROGESTERONE MICRONIZED 100 MG PO CAPS
100.0000 mg | ORAL_CAPSULE | Freq: Every day | ORAL | 0 refills | Status: AC
  Filled 2024-03-27: qty 30, 30d supply, fill #0

## 2024-03-27 MED ORDER — ESTRADIOL 0.05 MG/24HR TD PTTW
1.0000 | MEDICATED_PATCH | TRANSDERMAL | 0 refills | Status: AC
  Filled 2024-03-27: qty 8, 28d supply, fill #0

## 2024-04-20 ENCOUNTER — Other Ambulatory Visit (HOSPITAL_BASED_OUTPATIENT_CLINIC_OR_DEPARTMENT_OTHER): Payer: Self-pay

## 2024-04-20 DIAGNOSIS — N951 Menopausal and female climacteric states: Secondary | ICD-10-CM | POA: Diagnosis not present

## 2024-04-20 DIAGNOSIS — Z30432 Encounter for removal of intrauterine contraceptive device: Secondary | ICD-10-CM | POA: Diagnosis not present

## 2024-04-20 DIAGNOSIS — N952 Postmenopausal atrophic vaginitis: Secondary | ICD-10-CM | POA: Diagnosis not present

## 2024-04-20 MED ORDER — PROGESTERONE MICRONIZED 100 MG PO CAPS
100.0000 mg | ORAL_CAPSULE | Freq: Every day | ORAL | 3 refills | Status: AC
  Filled 2024-04-20: qty 60, 60d supply, fill #0
  Filled 2024-04-20: qty 30, 30d supply, fill #0
  Filled 2024-08-07: qty 90, 90d supply, fill #1
  Filled 2024-11-08: qty 90, 90d supply, fill #2

## 2024-04-20 MED ORDER — ESTRADIOL 0.075 MG/24HR TD PTTW
1.0000 | MEDICATED_PATCH | TRANSDERMAL | 0 refills | Status: AC
Start: 2024-04-20 — End: 2024-05-19
  Filled 2024-04-20: qty 8, 28d supply, fill #0

## 2024-04-21 ENCOUNTER — Other Ambulatory Visit (HOSPITAL_BASED_OUTPATIENT_CLINIC_OR_DEPARTMENT_OTHER): Payer: Self-pay

## 2024-04-27 ENCOUNTER — Ambulatory Visit: Payer: Self-pay | Admitting: Gastroenterology

## 2024-05-09 ENCOUNTER — Other Ambulatory Visit (HOSPITAL_BASED_OUTPATIENT_CLINIC_OR_DEPARTMENT_OTHER): Payer: Self-pay

## 2024-05-13 ENCOUNTER — Other Ambulatory Visit (HOSPITAL_BASED_OUTPATIENT_CLINIC_OR_DEPARTMENT_OTHER): Payer: Self-pay

## 2024-05-15 ENCOUNTER — Encounter (HOSPITAL_BASED_OUTPATIENT_CLINIC_OR_DEPARTMENT_OTHER): Payer: Self-pay

## 2024-05-15 ENCOUNTER — Other Ambulatory Visit (HOSPITAL_BASED_OUTPATIENT_CLINIC_OR_DEPARTMENT_OTHER): Payer: Self-pay

## 2024-05-16 ENCOUNTER — Other Ambulatory Visit (HOSPITAL_COMMUNITY): Payer: Self-pay

## 2024-05-16 ENCOUNTER — Other Ambulatory Visit (HOSPITAL_BASED_OUTPATIENT_CLINIC_OR_DEPARTMENT_OTHER): Payer: Self-pay

## 2024-05-16 MED ORDER — ESTRADIOL 0.075 MG/24HR TD PTTW
1.0000 | MEDICATED_PATCH | TRANSDERMAL | 0 refills | Status: DC
  Filled 2024-05-16: qty 8, 28d supply, fill #0

## 2024-05-31 ENCOUNTER — Other Ambulatory Visit (HOSPITAL_BASED_OUTPATIENT_CLINIC_OR_DEPARTMENT_OTHER): Payer: Self-pay

## 2024-06-03 ENCOUNTER — Other Ambulatory Visit (HOSPITAL_BASED_OUTPATIENT_CLINIC_OR_DEPARTMENT_OTHER): Payer: Self-pay

## 2024-06-05 DIAGNOSIS — H5213 Myopia, bilateral: Secondary | ICD-10-CM | POA: Diagnosis not present

## 2024-06-15 ENCOUNTER — Other Ambulatory Visit (HOSPITAL_BASED_OUTPATIENT_CLINIC_OR_DEPARTMENT_OTHER): Payer: Self-pay

## 2024-06-15 MED ORDER — ESTRADIOL 0.075 MG/24HR TD PTTW
1.0000 | MEDICATED_PATCH | TRANSDERMAL | 2 refills | Status: AC
  Filled 2024-06-15: qty 8, 28d supply, fill #0
  Filled 2024-08-07: qty 8, 28d supply, fill #1
  Filled 2024-09-06: qty 8, 28d supply, fill #2
  Filled 2024-10-03: qty 8, 28d supply, fill #3
  Filled 2024-11-08: qty 8, 28d supply, fill #4
  Filled 2024-12-02: qty 8, 28d supply, fill #5
  Filled 2025-01-01: qty 8, 28d supply, fill #6

## 2024-07-17 ENCOUNTER — Other Ambulatory Visit (HOSPITAL_BASED_OUTPATIENT_CLINIC_OR_DEPARTMENT_OTHER): Payer: Self-pay

## 2024-08-08 ENCOUNTER — Other Ambulatory Visit (HOSPITAL_BASED_OUTPATIENT_CLINIC_OR_DEPARTMENT_OTHER): Payer: Self-pay

## 2024-09-06 ENCOUNTER — Other Ambulatory Visit (HOSPITAL_BASED_OUTPATIENT_CLINIC_OR_DEPARTMENT_OTHER): Payer: Self-pay

## 2024-10-03 ENCOUNTER — Other Ambulatory Visit (HOSPITAL_BASED_OUTPATIENT_CLINIC_OR_DEPARTMENT_OTHER): Payer: Self-pay

## 2024-10-04 ENCOUNTER — Other Ambulatory Visit: Payer: Self-pay | Admitting: Family Medicine

## 2024-10-04 DIAGNOSIS — Z1231 Encounter for screening mammogram for malignant neoplasm of breast: Secondary | ICD-10-CM

## 2024-10-29 NOTE — Assessment & Plan Note (Signed)
 Supplement and monitor

## 2024-10-29 NOTE — Assessment & Plan Note (Addendum)
 Patient encouraged to25maintain heart healthy diet, regular exercise, adequate sleep. Consider daily probiotics. Take medications as prescribed.  Colonoscopy 03/2024 repeat in 2030 Labs ordered Pap 12/2023 repeat every 3-5 years Mgm 10/24 repeat every 1-2 years

## 2024-10-29 NOTE — Progress Notes (Signed)
 Subjective:    Patient ID: Valerie Jones, female    DOB: Jul 31, 1971, 53 y.o.   MRN: [id number]  No chief complaint on file.   HPI Discussed the use of AI scribe software for clinical note transcription with the patient, who gave verbal consent to proceed.  History of Present Illness Valerie Jones is a 53 year old female who presents for a routine follow-up and discussion of menopause management.  She switched gynecology providers less than a year ago due to her previous provider's lack of knowledge about menopause. She is now under the care of a specialist in menopause and reports significant improvement in her symptoms since starting hormone therapy. She is currently using a patch and progesterone , and the Mirena  IUD has been removed. She feels better than she has in two years.  She experiences recurring heartburn, particularly associated with running. She manages it with small meals and notes that it does not bother her when lying down at night.  Denies CP/palp/SOB/HA/congestion/fevers/GI or GU c/o. Taking meds as prescribed   Her mother passed away about a year and a half ago at the age of 29 after a six-week decline. She was under the care of [facility], which provided significant support during the end-of-life care. She has two sons, aged 67 and 5.    Past Medical History:  Diagnosis Date  . Abdominal pain 12/21/2016  . Acid reflux 08/16/2017  . Anxiety 03/24/2018  . ASCUS of cervix with negative high risk HPV 03/2017  . GERD (gastroesophageal reflux disease)   . H/O atopic dermatitis 03/17/2015  . Hemorrhoids   . History of chicken pox   . Post-operative nausea and vomiting   . Preventative health care 12/21/2016  . Urinary incontinence 12/21/2016  . Vitamin D  deficiency 12/21/2016    Past Surgical History:  Procedure Laterality Date  . APPENDECTOMY  1988  . COLONOSCOPY  03/06/2020  . INTRAUTERINE DEVICE INSERTION  11/13/2016   MIRENA     Family History   Problem Relation Age of Onset  . Hypertension Father   . Stroke Father   . Atrial fibrillation Father   . Dementia Father   . Cancer Maternal Grandfather        Non Hodgkins Lymphoma  . Hypertension Sister   . Other Sister        prediabetes  . Breast cancer Sister 52  . Cancer Sister        breast  . Mental illness Mother        anxiety  . Irritable bowel syndrome Mother   . Dementia Mother   . Colon polyps Mother   . COPD Maternal Grandmother        smoker  . Colon cancer Neg Hx   . Esophageal cancer Neg Hx   . Stomach cancer Neg Hx   . Rectal cancer Neg Hx     Social History   Socioeconomic History  . Marital status: Married    Spouse name: Not on file  . Number of children: Not on file  . Years of education: Not on file  . Highest education level: Not on file  Occupational History  . Not on file  Tobacco Use  . Smoking status: Never  . Smokeless tobacco: Never  Vaping Use  . Vaping status: Never Used  Substance and Sexual Activity  . Alcohol use: Yes    Alcohol/week: 2.0 standard drinks of alcohol    Types: 2 Glasses of wine per week  Comment: socially  . Drug use: No  . Sexual activity: Yes    Partners: Male    Birth control/protection: I.U.D.    Comment: Mirena  inserted 11/13/2016,,Pt declined sexual Hx   Other Topics Concern  . Not on file  Social History Narrative   Works as a Doctor, General Practice at [employer]   Married   2 children- (both sons) one at home and one has moved out   Enjoys reading, gardening, walking, running   2 dogs      Social Drivers of Corporate Investment Banker Strain: Not on file  Food Insecurity: Not on file  Transportation Needs: Not on file  Physical Activity: Not on file  Stress: Not on file  Social Connections: Not on file  Intimate Partner Violence: Not on file    Outpatient Medications Prior to Visit  Medication Sig Dispense Refill  . estradiol  (VIVELLE -DOT) 0.05 MG/24HR patch Place 1 patch (0.05 mg total)  onto the skin 2 (two) times a week. 8 patch 0  . estradiol  (VIVELLE -DOT) 0.075 MG/24HR Place 1 patch onto the skin 2 (two) times a week for 28 days. 8 patch 0  . estradiol  (VIVELLE -DOT) 0.075 MG/24HR Place 1 patch onto the skin 2 (two) times a week. 24 patch 2  . levonorgestrel  (MIRENA ) 20 MCG/24HR IUD 1 each by Intrauterine route once.    . progesterone  (PROMETRIUM ) 100 MG capsule Take 1 capsule (100 mg total) by mouth daily. 30 capsule 0  . progesterone  (PROMETRIUM ) 100 MG capsule Take 1 capsule (100 mg total) by mouth daily. 90 capsule 3  . VITAMIN D  PO Take 1,000 units of lipase by mouth.    . Zoster Vaccine Adjuvanted (SHINGRIX ) injection Inject 0.5mg  IM now and again in 2-6 months. (Patient not taking: Reported on 03/23/2024) 0.5 mL 1   No facility-administered medications prior to visit.    Allergies  Allergen Reactions  . Clindamycin/Lincomycin Hives  . Sulfa Antibiotics Hives    Review of Systems  Constitutional:  Negative for chills, fever and malaise/fatigue.  HENT:  Negative for congestion and hearing loss.   Eyes:  Negative for discharge.  Respiratory:  Negative for cough, sputum production and shortness of breath.   Cardiovascular:  Negative for chest pain, palpitations and leg swelling.  Gastrointestinal:  Positive for heartburn. Negative for abdominal pain, blood in stool, constipation, diarrhea, nausea and vomiting.  Genitourinary:  Negative for dysuria, frequency, hematuria and urgency.  Musculoskeletal:  Negative for back pain, falls and myalgias.  Skin:  Negative for rash.  Neurological:  Negative for dizziness, sensory change, loss of consciousness, weakness and headaches.  Endo/Heme/Allergies:  Negative for environmental allergies. Does not bruise/bleed easily.  Psychiatric/Behavioral:  Negative for depression and suicidal ideas. The patient is not nervous/anxious and does not have insomnia.        Objective:    Physical Exam Constitutional:      General:  She is not in acute distress.    Appearance: Normal appearance. She is not diaphoretic.  HENT:     Head: Normocephalic and atraumatic.     Right Ear: Tympanic membrane, ear canal and external ear normal.     Left Ear: Tympanic membrane, ear canal and external ear normal.     Nose: Nose normal.     Mouth/Throat:     Mouth: Mucous membranes are moist.     Pharynx: Oropharynx is clear. No oropharyngeal exudate.  Eyes:     General: No scleral icterus.  Right eye: No discharge.        Left eye: No discharge.     Conjunctiva/sclera: Conjunctivae normal.     Pupils: Pupils are equal, round, and reactive to light.  Neck:     Thyroid : No thyromegaly.  Cardiovascular:     Rate and Rhythm: Normal rate and regular rhythm.     Heart sounds: Normal heart sounds. No murmur heard. Pulmonary:     Effort: Pulmonary effort is normal. No respiratory distress.     Breath sounds: Normal breath sounds. No wheezing or rales.  Abdominal:     General: Bowel sounds are normal. There is no distension.     Palpations: Abdomen is soft. There is no mass.     Tenderness: There is no abdominal tenderness.  Musculoskeletal:        General: No tenderness. Normal range of motion.     Cervical back: Normal range of motion and neck supple.  Lymphadenopathy:     Cervical: No cervical adenopathy.  Skin:    General: Skin is warm and dry.     Findings: No rash.  Neurological:     General: No focal deficit present.     Mental Status: She is alert and oriented to person, place, and time.     Cranial Nerves: No cranial nerve deficit.     Coordination: Coordination normal.     Deep Tendon Reflexes: Reflexes are normal and symmetric. Reflexes normal.  Psychiatric:        Mood and Affect: Mood normal.        Behavior: Behavior normal.        Thought Content: Thought content normal.        Judgment: Judgment normal.    There were no vitals taken for this visit. Wt Readings from Last 3 Encounters:  03/23/24  129 lb (58.5 kg)  02/07/24 129 lb (58.5 kg)  01/20/24 130 lb (59 kg)    Diabetic Foot Exam - Simple   No data filed    Lab Results  Component Value Date   WBC 5.1 12/28/2023   HGB 12.7 12/28/2023   HCT 38.9 12/28/2023   PLT 299 12/28/2023   GLUCOSE 86 02/08/2024   CHOL 186 09/12/2019   TRIG 71.0 09/12/2019   HDL 75.00 09/12/2019   LDLCALC 97 09/12/2019   ALT 15 02/08/2024   AST 16 02/08/2024   NA 138 02/08/2024   K 4.1 02/08/2024   CL 102 02/08/2024   CREATININE 0.83 02/08/2024   BUN 27 (H) 02/08/2024   CO2 25 02/08/2024   TSH 1.95 12/28/2023   HGBA1C 5.5 09/12/2019    Lab Results  Component Value Date   TSH 1.95 12/28/2023   Lab Results  Component Value Date   WBC 5.1 12/28/2023   HGB 12.7 12/28/2023   HCT 38.9 12/28/2023   MCV 94.9 12/28/2023   PLT 299 12/28/2023   Lab Results  Component Value Date   NA 138 02/08/2024   K 4.1 02/08/2024   CO2 25 02/08/2024   GLUCOSE 86 02/08/2024   BUN 27 (H) 02/08/2024   CREATININE 0.83 02/08/2024   BILITOT 0.5 02/08/2024   ALKPHOS 33 (L) 10/29/2020   AST 16 02/08/2024   ALT 15 02/08/2024   PROT 6.7 02/08/2024   ALBUMIN 4.2 10/29/2020   CALCIUM  9.6 02/08/2024   EGFR 84 02/08/2024   GFR 85.19 10/29/2020   Lab Results  Component Value Date   CHOL 186 09/12/2019   Lab Results  Component Value  Date   HDL 75.00 09/12/2019   Lab Results  Component Value Date   LDLCALC 97 09/12/2019   Lab Results  Component Value Date   TRIG 71.0 09/12/2019   Lab Results  Component Value Date   CHOLHDL 2 09/12/2019   Lab Results  Component Value Date   HGBA1C 5.5 09/12/2019       Assessment & Plan:  Vitamin D  deficiency Assessment & Plan: Supplement and monitor   Hyperglycemia Assessment & Plan:  minimize simple carbs. Increase exercise as tolerated. Continue current meds   Preventative health care Assessment & Plan: Patient encouraged to55maintain heart healthy diet, regular exercise, adequate sleep.  Consider daily probiotics. Take medications as prescribed.  Colonoscopy 03/2024 repeat in 2030 Labs ordered Pap 12/2023 repeat every 3-5 years Mgm 10/24 repeat every 1-2 years     Assessment and Plan Assessment & Plan Adult Wellness Visit Routine adult wellness visit with no significant changes in family history. Recent mammogram and Pap smear results are normal. Colonoscopy is due in 2030. Discussed hydration and protein intake for muscle mass maintenance. Reviewed advanced directives and healthcare power of attorney. - Ordered metabolic panel, CBC, thyroid , and cholesterol tests - Recommended shingles vaccine due to correlation with decreased dementia risk - Recommended annual flu and COVID vaccines - Encouraged hydration with 5-10 ounces of water every hour - Encouraged protein intake every 3-4 hours - Requested proof of flu vaccination from [employer] - Requested advanced directives and healthcare power of attorney documents  Gastroesophageal Reflux Disease Recurrent heartburn, particularly associated with running. Symptoms are managed with dietary modifications. - Advised small meals and dietary modifications to manage symptoms  Vitamin D  Deficiency Previous vitamin D  deficiency with recent levels slightly improved but still low. - Ordered vitamin D  level test  Recording duration: 37 minutes     Harlene Horton, MD

## 2024-10-29 NOTE — Assessment & Plan Note (Signed)
 minimize simple carbs. Increase exercise as tolerated. Continue current meds

## 2024-10-31 ENCOUNTER — Ambulatory Visit
Admission: RE | Admit: 2024-10-31 | Discharge: 2024-10-31 | Disposition: A | Discharge status: Home / Self Care | Source: Ambulatory Visit | Attending: Family Medicine | Admitting: Family Medicine

## 2024-10-31 DIAGNOSIS — Z1231 Encounter for screening mammogram for malignant neoplasm of breast: Secondary | ICD-10-CM

## 2024-11-02 ENCOUNTER — Encounter: Payer: Self-pay | Admitting: Family Medicine

## 2024-11-02 ENCOUNTER — Ambulatory Visit (INDEPENDENT_AMBULATORY_CARE_PROVIDER_SITE_OTHER): Admitting: Family Medicine

## 2024-11-02 VITALS — BP 100/64 | HR 92 | Temp 97.8°F | Resp 16 | Ht 64.0 in | Wt 133.2 lb

## 2024-11-02 DIAGNOSIS — E559 Vitamin D deficiency, unspecified: Secondary | ICD-10-CM

## 2024-11-02 DIAGNOSIS — K219 Gastro-esophageal reflux disease without esophagitis: Secondary | ICD-10-CM | POA: Diagnosis not present

## 2024-11-02 DIAGNOSIS — R739 Hyperglycemia, unspecified: Secondary | ICD-10-CM | POA: Diagnosis not present

## 2024-11-02 DIAGNOSIS — Z0000 Encounter for general adult medical examination without abnormal findings: Secondary | ICD-10-CM

## 2024-11-02 LAB — CBC WITH DIFFERENTIAL/PLATELET
Basophils Absolute: 0 K/uL (ref 0.0–0.1)
Basophils Relative: 0.9 % (ref 0.0–3.0)
Eosinophils Absolute: 0.1 K/uL (ref 0.0–0.7)
Eosinophils Relative: 1.8 % (ref 0.0–5.0)
HCT: 40.1 % (ref 36.0–46.0)
Hemoglobin: 13.3 g/dL (ref 12.0–15.0)
Lymphocytes Relative: 32.3 % (ref 12.0–46.0)
Lymphs Abs: 1.2 K/uL (ref 0.7–4.0)
MCHC: 33.1 g/dL (ref 30.0–36.0)
MCV: 95.3 fl (ref 78.0–100.0)
Monocytes Absolute: 0.4 K/uL (ref 0.1–1.0)
Monocytes Relative: 10.5 % (ref 3.0–12.0)
Neutro Abs: 2 K/uL (ref 1.4–7.7)
Neutrophils Relative %: 54.5 % (ref 43.0–77.0)
Platelets: 290 K/uL (ref 150.0–400.0)
RBC: 4.21 Mil/uL (ref 3.87–5.11)
RDW: 13.7 % (ref 11.5–15.5)
WBC: 3.7 K/uL — ABNORMAL LOW (ref 4.0–10.5)

## 2024-11-02 LAB — COMPREHENSIVE METABOLIC PANEL WITH GFR
ALT: 16 U/L (ref 0–35)
AST: 19 U/L (ref 0–37)
Albumin: 4.4 g/dL (ref 3.5–5.2)
Alkaline Phosphatase: 41 U/L (ref 39–117)
BUN: 13 mg/dL (ref 6–23)
CO2: 32 meq/L (ref 19–32)
Calcium: 9.1 mg/dL (ref 8.4–10.5)
Chloride: 100 meq/L (ref 96–112)
Creatinine, Ser: 0.79 mg/dL (ref 0.40–1.20)
GFR: 84.09 mL/min (ref >60.00)
Glucose, Bld: 91 mg/dL (ref 70–99)
Potassium: 4.5 meq/L (ref 3.5–5.1)
Sodium: 137 meq/L (ref 135–145)
Total Bilirubin: 0.6 mg/dL (ref 0.2–1.2)
Total Protein: 6.6 g/dL (ref 6.0–8.3)

## 2024-11-02 LAB — HEMOGLOBIN A1C: Hgb A1c MFr Bld: 5.4 % (ref 4.6–6.5)

## 2024-11-02 LAB — LIPID PANEL
Cholesterol: 246 mg/dL — ABNORMAL HIGH (ref 0–200)
HDL: 92.4 mg/dL (ref >39.00)
LDL Cholesterol: 139 mg/dL — ABNORMAL HIGH (ref 0–99)
NonHDL: 153.19
Total CHOL/HDL Ratio: 3
Triglycerides: 69 mg/dL (ref 0.0–149.0)
VLDL: 13.8 mg/dL (ref 0.0–40.0)

## 2024-11-02 LAB — TSH: TSH: 1.61 u[IU]/mL (ref 0.35–5.50)

## 2024-11-02 LAB — VITAMIN D 25 HYDROXY (VIT D DEFICIENCY, FRACTURES): VITD: 36.5 ng/mL (ref 30.00–100.00)

## 2024-11-02 NOTE — Patient Instructions (Addendum)
 Shingrix  is the new shingles shot, 2 shots over 2-6 months, confirm coverage with insurance and document, then can return here for shots with nurse appt or at pharmacy   FLU shot  Preventive Care 70-55 Years Old, Female Preventive care refers to lifestyle choices and visits with your health care provider that can promote health and wellness. Preventive care visits are also called wellness exams. What can I expect for my preventive care visit? Counseling Your health care provider may ask you questions about your: Medical history, including: Past medical problems. Family medical history. Pregnancy history. Current health, including: Menstrual cycle. Method of birth control. Emotional well-being. Home life and relationship well-being. Sexual activity and sexual health. Lifestyle, including: Alcohol, nicotine or tobacco, and drug use. Access to firearms. Diet, exercise, and sleep habits. Work and work astronomer. Sunscreen use. Safety issues such as seatbelt and bike helmet use. Physical exam Your health care provider will check your: Height and weight. These may be used to calculate your BMI (body mass index). BMI is a measurement that tells if you are at a healthy weight. Waist circumference. This measures the distance around your waistline. This measurement also tells if you are at a healthy weight and may help predict your risk of certain diseases, such as type 2 diabetes and high blood pressure. Heart rate and blood pressure. Body temperature. Skin for abnormal spots. What immunizations do I need?  Vaccines are usually given at various ages, according to a schedule. Your health care provider will recommend vaccines for you based on your age, medical history, and lifestyle or other factors, such as travel or where you work. What tests do I need? Screening Your health care provider may recommend screening tests for certain conditions. This may include: Lipid and cholesterol  levels. Diabetes screening. This is done by checking your blood sugar (glucose) after you have not eaten for a while (fasting). Pelvic exam and Pap test. Hepatitis B test. Hepatitis C test. HIV (human immunodeficiency virus) test. STI (sexually transmitted infection) testing, if you are at risk. Lung cancer screening. Colorectal cancer screening. Mammogram. Talk with your health care provider about when you should start having regular mammograms. This may depend on whether you have a family history of breast cancer. BRCA-related cancer screening. This may be done if you have a family history of breast, ovarian, tubal, or peritoneal cancers. Bone density scan. This is done to screen for osteoporosis. Talk with your health care provider about your test results, treatment options, and if necessary, the need for more tests. Follow these instructions at home: Eating and drinking  Eat a diet that includes fresh fruits and vegetables, whole grains, lean protein, and low-fat dairy products. Take vitamin and mineral supplements as recommended by your health care provider. Do not drink alcohol if: Your health care provider tells you not to drink. You are pregnant, may be pregnant, or are planning to become pregnant. If you drink alcohol: Limit how much you have to 0-1 drink a day. Know how much alcohol is in your drink. In the [place] one drink equals one 12 oz bottle of beer (355 mL), one 5 oz glass of wine (148 mL), or one 1 oz glass of hard liquor (44 mL). Lifestyle Brush your teeth every morning and night with fluoride toothpaste. Floss one time each day. Exercise for at least 30 minutes 5 or more days each week. Do not use any products that contain nicotine or tobacco. These products include cigarettes, chewing tobacco, and vaping devices,  such as e-cigarettes. If you need help quitting, ask your health care provider. Do not use drugs. If you are sexually active, practice safe sex. Use a  condom or other form of protection to prevent STIs. If you do not wish to become pregnant, use a form of birth control. If you plan to become pregnant, see your health care provider for a prepregnancy visit. Take aspirin  only as told by your health care provider. Make sure that you understand how much to take and what form to take. Work with your health care provider to find out whether it is safe and beneficial for you to take aspirin  daily. Find healthy ways to manage stress, such as: Meditation, yoga, or listening to music. Journaling. Talking to a trusted person. Spending time with friends and family. Minimize exposure to UV radiation to reduce your risk of skin cancer. Safety Always wear your seat belt while driving or riding in a vehicle. Do not drive: If you have been drinking alcohol. Do not ride with someone who has been drinking. When you are tired or distracted. While texting. If you have been using any mind-altering substances or drugs. Wear a helmet and other protective equipment during sports activities. If you have firearms in your house, make sure you follow all gun safety procedures. Seek help if you have been physically or sexually abused. What's next? Visit your health care provider once a year for an annual wellness visit. Ask your health care provider how often you should have your eyes and teeth checked. Stay up to date on all vaccines. This information is not intended to replace advice given to you by your health care provider. Make sure you discuss any questions you have with your health care provider. Document Revised: 05/28/2021 Document Reviewed: 05/28/2021 [employer]vier Patient Education  2024 [employer].

## 2024-11-03 ENCOUNTER — Ambulatory Visit: Payer: Self-pay | Admitting: Family Medicine

## 2024-11-03 DIAGNOSIS — D72819 Decreased white blood cell count, unspecified: Secondary | ICD-10-CM

## 2024-11-03 DIAGNOSIS — E785 Hyperlipidemia, unspecified: Secondary | ICD-10-CM

## 2024-11-03 DIAGNOSIS — E78019 Familial hypercholesterolemia, unspecified: Secondary | ICD-10-CM

## 2024-11-08 ENCOUNTER — Other Ambulatory Visit (HOSPITAL_BASED_OUTPATIENT_CLINIC_OR_DEPARTMENT_OTHER): Payer: Self-pay

## 2024-12-02 ENCOUNTER — Other Ambulatory Visit (HOSPITAL_BASED_OUTPATIENT_CLINIC_OR_DEPARTMENT_OTHER): Payer: Self-pay

## 2025-01-01 ENCOUNTER — Other Ambulatory Visit (HOSPITAL_BASED_OUTPATIENT_CLINIC_OR_DEPARTMENT_OTHER): Payer: Self-pay

## 2025-03-19 ENCOUNTER — Encounter: Admitting: Family Medicine

## 2025-11-12 ENCOUNTER — Encounter: Admitting: Family Medicine
